# Patient Record
Sex: Female | Born: 1940 | Race: White | Hispanic: No | Marital: Married | Smoking: Never smoker
Health system: Southern US, Community
[De-identification: ages and names within clinical notes are randomized; demographics above are authoritative.]

## PROBLEM LIST (undated history)

## (undated) DIAGNOSIS — I10 Essential (primary) hypertension: Secondary | ICD-10-CM

## (undated) DIAGNOSIS — M1990 Unspecified osteoarthritis, unspecified site: Secondary | ICD-10-CM

## (undated) DIAGNOSIS — T7840XA Allergy, unspecified, initial encounter: Secondary | ICD-10-CM

## (undated) HISTORY — DX: Unspecified osteoarthritis, unspecified site: M19.90

## (undated) HISTORY — PX: ABDOMINAL HYSTERECTOMY: SHX81

## (undated) HISTORY — PX: CHOLECYSTECTOMY: SHX55

## (undated) HISTORY — PX: CARPAL TUNNEL RELEASE: SHX101

## (undated) HISTORY — DX: Essential (primary) hypertension: I10

## (undated) HISTORY — DX: Allergy, unspecified, initial encounter: T78.40XA

---

## 2002-06-30 ENCOUNTER — Encounter: Admission: RE | Admit: 2002-06-30 | Discharge: 2002-06-30 | Payer: Self-pay | Admitting: Family Medicine

## 2002-06-30 ENCOUNTER — Encounter: Payer: Self-pay | Admitting: Family Medicine

## 2003-10-20 ENCOUNTER — Inpatient Hospital Stay (HOSPITAL_COMMUNITY): Admission: EM | Admit: 2003-10-20 | Discharge: 2003-10-21 | Payer: Self-pay | Admitting: Emergency Medicine

## 2003-10-20 ENCOUNTER — Encounter (INDEPENDENT_AMBULATORY_CARE_PROVIDER_SITE_OTHER): Payer: Self-pay | Admitting: *Deleted

## 2004-09-06 ENCOUNTER — Encounter: Admission: RE | Admit: 2004-09-06 | Discharge: 2004-09-06 | Payer: Self-pay | Admitting: Family Medicine

## 2005-02-06 ENCOUNTER — Encounter: Admission: RE | Admit: 2005-02-06 | Discharge: 2005-02-06 | Payer: Self-pay | Admitting: Family Medicine

## 2005-09-17 ENCOUNTER — Other Ambulatory Visit: Admission: RE | Admit: 2005-09-17 | Discharge: 2005-09-17 | Payer: Self-pay | Admitting: Family Medicine

## 2006-02-19 ENCOUNTER — Encounter: Admission: RE | Admit: 2006-02-19 | Discharge: 2006-02-19 | Payer: Self-pay | Admitting: Family Medicine

## 2009-12-26 ENCOUNTER — Encounter: Admission: RE | Admit: 2009-12-26 | Discharge: 2009-12-26 | Payer: Self-pay | Admitting: Family Medicine

## 2010-11-22 ENCOUNTER — Observation Stay (HOSPITAL_COMMUNITY)
Admission: EM | Admit: 2010-11-22 | Discharge: 2010-11-24 | Payer: Self-pay | Source: Home / Self Care | Attending: Internal Medicine | Admitting: Internal Medicine

## 2010-11-23 ENCOUNTER — Encounter (INDEPENDENT_AMBULATORY_CARE_PROVIDER_SITE_OTHER): Payer: Self-pay | Admitting: Internal Medicine

## 2010-12-14 ENCOUNTER — Encounter: Payer: Self-pay | Admitting: Family Medicine

## 2010-12-18 NOTE — Discharge Summary (Addendum)
Cynthia Daugherty, Cynthia Daugherty             ACCOUNT NO.:  [id number]  MEDICAL RECORD NO.:  [id number]          PATIENT TYPE:  OBV  LOCATION:  3730                         FACILITY:  [facility]  PHYSICIAN:  Rock Nephew, MD       DATE OF BIRTH:  08/25/1941  DATE OF ADMISSION:  11/22/2010 DATE OF DISCHARGE:  11/24/2010                        DISCHARGE SUMMARY - REFERRING   PRIMARY CARE PHYSICIAN:  Juluis Rainier, MD.  DISCHARGE DIAGNOSES: 1. Chest pain probable noncardiac chest pain with a negative stress     test. 2. Hypertension. 3. Reflux. 4. Increased lipase. 5. Dyspepsia.  DISCHARGE MEDICATIONS: 1. Zofran 4 mg by mouth every 6 hours needed. 2. Pantoprazole 40 mg by mouth twice daily. 3. Vitamin D3 over-the-counter 3 tablets by mouth daily.  DISPOSITION:  The patient is discharged home.  The patient should follow up with Dr. Juluis Rainier within 1 week.  The patient should obtain a referral to see a gastroenterologist.CONSULTATIONS:  Armanda Magic, MD, [facility].  PROCEDURES PERFORMED:  The patient had a chest x-ray that showed no acute cardiopulmonary process.  The patient had a CT scan of the abdomen and pelvis with contrast, which showed no acute intraabdominal or pelvic pathology.  Specifically, the pancreas appears normal and no peripancreatic inflammatory change or collection is identified.  The patient had a myocardial imaging with a SPECT rest and exercise, which was a normal examination.  A derived left ventricular ejection fraction was 69%.  DIET:  Regular.  INITIAL HISTORY AND PHYSICAL:  A chief complaint of chest pressure, nausea, and sweats.  A 70 year old female, who for the past 1 week has been having some intermittent chest pressure-like, which is fairly severe and thought to be like indigestion or severe heartburn.  She has felt fatigued and she has occasional diaphoresis.  She was admitted to the hospital for a chest pain evaluation.  PROBLEM  LIST: 1. Chest pain.  The patient was evaluated by Dr. Armanda Magic of [facility]     Cardiology.  She did not think that this was cardiac chest pain.     The patient had a stress test done and the stress test was     negative.  The patient's cardiac enzymes were negative.  The     patient will need no further cardiac workup.  The patient will need     an outpatient referral to see a gastroenterologist for a possible     upper endoscopy. 2. Hypertension.  The patient's blood pressure is controlled.  The     patient received some hydrochlorothiazide in the hospital, but it     does not appear to be her home medication so we will not discharge     her on antihypertensives. 3. Increased lipase and dyspepsia.  The patient has been having     dyspepsia as well as the patient had a lipase that was initially     elevated at 175 but was 35 the day after.  The patient's CT scan of     the abdomen and pelvis was unremarkable.  The patient will need a     GI consult for a possible  upper endoscopy as an outpatient. 4. DVT prophylaxis.  The patient received some Lovenox. 5. Possible history of GERD.  The patient was placed on a PPI.  The     patient will be discharged on a PPI. 6. Fibromyalgia.  The patient has a history of fibromyalgia, which we     will monitor.     Rock Nephew, MD     NH/MEDQ  D:  11/24/2010  T:  11/24/2010  Job:  [id number]  cc:   Juluis Rainier, M.D. Armanda Magic, M.D.  Electronically Signed by Rock Nephew MD on 12/18/2010 06:12:02 PM

## 2010-12-21 NOTE — H&P (Signed)
NAME:  Cynthia Daugherty, LANDECK NO.:  [id number]  MEDICAL RECORD NO.:  [id number]          PATIENT TYPE:  EMS  LOCATION:  MAJO                         FACILITY:  [facility]  PHYSICIAN:  Michiel Cowboy, MDDATE OF BIRTH:  1941/10/03  DATE OF ADMISSION:  11/22/2010 DATE OF DISCHARGE:                             HISTORY & PHYSICAL   ATTENDING PHYSICIAN:  Michiel Cowboy, MD  PRIMARY CARE PROVIDER:  Juluis Rainier, MD, [employer] [facility].  CHIEF COMPLAINT:  Chest pressure, nausea, sweats.  The patient is a 70 year old female who for the past 1 week have been having intermittent chest pressure-like which was fairly severe thought like indigestion or severe heartburn.  She felt fatigued.  She had occasional diaphoresis, but no fevers, no chills.  She has had some nausea, but no vomiting.  No diarrhea.  No significant constipation. She does not say can tell for sure if exertion makes it worse or better, but does state that she feels very fatigued with exertion which is new for her.  Otherwise, no other complaints.  She have had a little bit of epigastric discomfort as well.  The pain is non radiational otherwise.  REVIEW OF SYSTEMS:  Negative except for HPI.  PAST MEDICAL HISTORY: 1. Significant for GERD. 2. Fibromyalgia. 3. Status post cholecystectomy. 4. History of hysterectomy.  SOCIAL HISTORY:  The patient used to smoke about 45 years ago and not currently, does not drink or abuse drugs.  FAMILY HISTORY:  Significant for mother and sister with diabetes, but no coronary artery disease in the family.  ALLERGIES:  To SULFA.  MEDICATIONS:  Recently, have been taking the baby aspirin and Prilosec as needed.  PHYSICAL EXAMINATION:  VITAL SIGNS:  Temperature 98.2, blood pressure 165/61.  Of note, she usually does not have elevated blood pressure, pulse 58, respirations 16, satting 97% on room air. GENERAL:  The patient appears to be  in no acute distress. HEAD:  Nontraumatic.  Moist mucous membranes. LUNGS:  Clear to auscultation bilaterally. HEART:  Regular rate and rhythm.  No murmurs appreciated. ABDOMEN:  Slightly obese.  She does claim there is a mild epigastric tenderness.  There is no rebound or guarding. LOWER EXTREMITIES:  Without clubbing, cyanosis or edema. NEUROLOGICAL:  Grossly intact. SKIN:  Clean, dry and intact.  No rashes noted.  LABORATORY DATA:  White blood cell count 7.4, hemoglobin 15.  Sodium 139, potassium 4.1, creatinine 0.7, blood sugar 102, albumin 3.8, lipase slightly elevated 175.  UA showing 3-6 white blood cells, but otherwise unremarkable.  Cardiac enzymes negative.  Chest x-ray showed no changes. EKG showing sinus bradycardia at heart rate of 58.  There is no ST changes.  There was just ischemia.  Q waves are slightly more peaked than her prior EKG, but this will only change I can notice.  This is otherwise unremarkable.  ASSESSMENT AND PLAN: 1. This is a 70 year old female with history of chest pressure for     intermittent for past 1 week.  At this point, she has continuously     negative cardiac markers.  No changes on EKG, but given her  advanced age she is to be further evaluated. 2. Chest pain.  We will admit.  We will continue to cycle cardiac     enzymes.  We will attempt to get an echogram to evaluate for wall     motion abnormality, further stratify with fasting lipid panel,     hemoglobin A1c.  I believe she would benefit from a stress test.     We will call Cardiology in a.m., now being along weekend I am not     sure when this can be done, this possibly has to be followed up as     an outpatient. 3. Elevated blood pressure who has had a history of hypertension and     we will follow and we will write for hydralazine as needed.  We     will avoid labetalol or beta-blockers as she has slightly low heart     rate. 4. Elevated lipase.  We will follow at this point.   She does not     clinically say when she has pancreatitis.  A CT scan did not show     any evidence of pancreatitis.  All of this suggestive of isolated     lipase elevation without current pancreatitis. 5. Prophylaxis Protonix and Lovenox.     Michiel Cowboy, MD     AVD/MEDQ  D:  11/23/2010  T:  11/23/2010  Job:  [id number]  cc:   Juluis Rainier, M.D.  Electronically Signed by Therisa Doyne MD on 12/21/2010 06:15:16 AM

## 2011-01-08 ENCOUNTER — Ambulatory Visit (HOSPITAL_COMMUNITY)
Admission: RE | Admit: 2011-01-08 | Discharge: 2011-01-08 | Disposition: A | Discharge status: Home / Self Care | Payer: Medicare Other | Source: Ambulatory Visit | Attending: Gastroenterology | Admitting: Gastroenterology

## 2011-01-08 ENCOUNTER — Other Ambulatory Visit: Payer: Self-pay | Admitting: Gastroenterology

## 2011-01-08 DIAGNOSIS — R1013 Epigastric pain: Secondary | ICD-10-CM | POA: Insufficient documentation

## 2011-01-08 DIAGNOSIS — R1310 Dysphagia, unspecified: Secondary | ICD-10-CM | POA: Insufficient documentation

## 2011-01-08 DIAGNOSIS — K219 Gastro-esophageal reflux disease without esophagitis: Secondary | ICD-10-CM | POA: Insufficient documentation

## 2011-01-08 DIAGNOSIS — R112 Nausea with vomiting, unspecified: Secondary | ICD-10-CM | POA: Insufficient documentation

## 2011-01-08 DIAGNOSIS — K2990 Gastroduodenitis, unspecified, without bleeding: Secondary | ICD-10-CM | POA: Insufficient documentation

## 2011-01-08 DIAGNOSIS — I10 Essential (primary) hypertension: Secondary | ICD-10-CM | POA: Insufficient documentation

## 2011-01-08 DIAGNOSIS — K2970 Gastritis, unspecified, without bleeding: Secondary | ICD-10-CM | POA: Insufficient documentation

## 2011-01-08 DIAGNOSIS — E785 Hyperlipidemia, unspecified: Secondary | ICD-10-CM | POA: Insufficient documentation

## 2011-01-15 ENCOUNTER — Other Ambulatory Visit (HOSPITAL_COMMUNITY): Payer: Self-pay | Admitting: Gastroenterology

## 2011-01-15 DIAGNOSIS — R112 Nausea with vomiting, unspecified: Secondary | ICD-10-CM

## 2011-01-15 DIAGNOSIS — R1084 Generalized abdominal pain: Secondary | ICD-10-CM

## 2011-01-24 ENCOUNTER — Ambulatory Visit (HOSPITAL_COMMUNITY)
Admission: RE | Admit: 2011-01-24 | Discharge: 2011-01-24 | Disposition: A | Discharge status: Home / Self Care | Payer: Medicare Other | Source: Ambulatory Visit | Attending: Gastroenterology | Admitting: Gastroenterology

## 2011-01-24 DIAGNOSIS — R1084 Generalized abdominal pain: Secondary | ICD-10-CM

## 2011-01-24 DIAGNOSIS — R112 Nausea with vomiting, unspecified: Secondary | ICD-10-CM | POA: Insufficient documentation

## 2011-01-24 DIAGNOSIS — R109 Unspecified abdominal pain: Secondary | ICD-10-CM | POA: Insufficient documentation

## 2011-01-24 MED ORDER — TECHNETIUM TC 99M SULFUR COLLOID
2.0000 | Freq: Once | INTRAVENOUS | Status: AC | PRN
  Administered 2011-01-24: 2 via ORAL

## 2011-02-03 LAB — COMPREHENSIVE METABOLIC PANEL
AST: 19 U/L (ref 0–37)
AST: 21 U/L (ref 0–37)
Albumin: 3.4 g/dL — ABNORMAL LOW (ref 3.5–5.2)
Alkaline Phosphatase: 53 U/L (ref 39–117)
Calcium: 8.8 mg/dL (ref 8.4–10.5)
Calcium: 9.3 mg/dL (ref 8.4–10.5)
Chloride: 105 mEq/L (ref 96–112)
Creatinine, Ser: 0.6 mg/dL (ref 0.4–1.2)
GFR calc Af Amer: >60 mL/min (ref >60)
GFR calc Af Amer: >60 mL/min (ref >60)
Glucose, Bld: 102 mg/dL — ABNORMAL HIGH (ref 70–99)
Glucose, Bld: 105 mg/dL — ABNORMAL HIGH (ref 70–99)
Potassium: 4 mEq/L (ref 3.5–5.1)
Potassium: 4.2 mEq/L (ref 3.5–5.1)
Total Bilirubin: 0.4 mg/dL (ref 0.3–1.2)
Total Bilirubin: 0.8 mg/dL (ref 0.3–1.2)

## 2011-02-03 LAB — CBC
HCT: 41.5 % (ref 36.0–46.0)
HCT: 41.5 % (ref 36.0–46.0)
Hemoglobin: 13.7 g/dL (ref 12.0–15.0)
MCH: 29.2 pg (ref 26.0–34.0)
MCH: 29.6 pg (ref 26.0–34.0)
MCHC: 32.3 g/dL (ref 30.0–36.0)
MCHC: 33 g/dL (ref 30.0–36.0)
MCHC: 33 g/dL (ref 30.0–36.0)
MCV: 89.4 fL (ref 78.0–100.0)
MCV: 89.8 fL (ref 78.0–100.0)
MCV: 90.4 fL (ref 78.0–100.0)
Platelets: 267 10*3/uL (ref 150–400)
Platelets: 270 10*3/uL (ref 150–400)
Platelets: 290 10*3/uL (ref 150–400)
RBC: 4.59 MIL/uL (ref 3.87–5.11)
RBC: 4.64 MIL/uL (ref 3.87–5.11)
RDW: 13.4 % (ref 11.5–15.5)
WBC: 6.9 10*3/uL (ref 4.0–10.5)
WBC: 7.4 10*3/uL (ref 4.0–10.5)

## 2011-02-03 LAB — CARDIAC PANEL(CRET KIN+CKTOT+MB+TROPI)
CK, MB: 1.4 ng/mL (ref 0.3–4.0)
Relative Index: INVALID (ref 0.0–2.5)
Relative Index: INVALID (ref 0.0–2.5)
Total CK: 51 U/L (ref 7–177)
Troponin I: 0.01 ng/mL (ref 0.00–0.06)
Troponin I: 0.04 ng/mL (ref 0.00–0.06)

## 2011-02-03 LAB — URINE MICROSCOPIC-ADD ON

## 2011-02-03 LAB — POCT I-STAT, CHEM 8
BUN: 11 mg/dL (ref 6–23)
Chloride: 103 mEq/L (ref 96–112)
Creatinine, Ser: 0.7 mg/dL (ref 0.4–1.2)
Potassium: 4.1 mEq/L (ref 3.5–5.1)
Sodium: 139 mEq/L (ref 135–145)
TCO2: 26 mmol/L (ref 0–100)

## 2011-02-03 LAB — URINALYSIS, ROUTINE W REFLEX MICROSCOPIC
Bilirubin Urine: NEGATIVE
Ketones, ur: NEGATIVE mg/dL
Specific Gravity, Urine: 1.004 — ABNORMAL LOW (ref 1.005–1.030)
pH: 6.5 (ref 5.0–8.0)

## 2011-02-03 LAB — PHOSPHORUS: Phosphorus: 4.3 mg/dL (ref 2.3–4.6)

## 2011-02-03 LAB — DIFFERENTIAL
Basophils Absolute: 0 10*3/uL (ref 0.0–0.1)
Basophils Relative: 0 % (ref 0–1)
Lymphocytes Relative: 30 % (ref 12–46)
Neutrophils Relative %: 62 % (ref 43–77)

## 2011-02-03 LAB — BASIC METABOLIC PANEL
CO2: 30 mEq/L (ref 19–32)
Calcium: 8.9 mg/dL (ref 8.4–10.5)
Chloride: 103 mEq/L (ref 96–112)
Creatinine, Ser: 0.83 mg/dL (ref 0.4–1.2)
GFR calc Af Amer: >60 mL/min (ref >60)
Glucose, Bld: 108 mg/dL — ABNORMAL HIGH (ref 70–99)

## 2011-02-03 LAB — TSH: TSH: 1.726 u[IU]/mL (ref 0.350–4.500)

## 2011-02-03 LAB — LIPID PANEL
Cholesterol: 193 mg/dL (ref 0–200)
LDL Cholesterol: 117 mg/dL — ABNORMAL HIGH (ref 0–99)
Total CHOL/HDL Ratio: 5.1 RATIO

## 2011-02-03 LAB — POCT CARDIAC MARKERS
CKMB, poc: <1 ng/mL — ABNORMAL LOW (ref 1.0–8.0)
Troponin i, poc: <0.05 ng/mL (ref 0.00–0.09)

## 2011-02-03 LAB — TROPONIN I: Troponin I: 0.01 ng/mL (ref 0.00–0.06)

## 2011-02-03 LAB — LIPASE, BLOOD: Lipase: 175 U/L — ABNORMAL HIGH (ref 11–59)

## 2011-02-03 LAB — HEMOGLOBIN A1C: Hgb A1c MFr Bld: 5.7 % — ABNORMAL HIGH (ref <5.7)

## 2011-02-03 LAB — CK TOTAL AND CKMB (NOT AT ARMC): Relative Index: INVALID (ref 0.0–2.5)
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
## 2011-11-12 ENCOUNTER — Emergency Department (HOSPITAL_COMMUNITY)
Admission: EM | Admit: 2011-11-12 | Discharge: 2011-11-12 | Disposition: A | Discharge status: Home / Self Care | Payer: Medicare Other | Attending: Emergency Medicine | Admitting: Emergency Medicine

## 2011-11-12 ENCOUNTER — Emergency Department (HOSPITAL_COMMUNITY): Payer: Medicare Other

## 2011-11-12 ENCOUNTER — Encounter: Payer: Self-pay | Admitting: Emergency Medicine

## 2011-11-12 DIAGNOSIS — W19XXXA Unspecified fall, initial encounter: Secondary | ICD-10-CM

## 2011-11-12 DIAGNOSIS — M545 Low back pain, unspecified: Secondary | ICD-10-CM | POA: Insufficient documentation

## 2011-11-12 DIAGNOSIS — M546 Pain in thoracic spine: Secondary | ICD-10-CM | POA: Insufficient documentation

## 2011-11-12 DIAGNOSIS — M533 Sacrococcygeal disorders, not elsewhere classified: Secondary | ICD-10-CM | POA: Insufficient documentation

## 2011-11-12 DIAGNOSIS — F0781 Postconcussional syndrome: Secondary | ICD-10-CM | POA: Insufficient documentation

## 2011-11-12 DIAGNOSIS — W108XXA Fall (on) (from) other stairs and steps, initial encounter: Secondary | ICD-10-CM | POA: Insufficient documentation

## 2011-11-12 DIAGNOSIS — R51 Headache: Secondary | ICD-10-CM | POA: Insufficient documentation

## 2011-11-12 MED ORDER — HYDROCODONE-ACETAMINOPHEN 5-500 MG PO TABS: 1.0000 | ORAL_TABLET | ORAL | Status: AC | PRN

## 2011-11-12 MED ORDER — HYDROCODONE-ACETAMINOPHEN 5-325 MG PO TABS
1.0000 | ORAL_TABLET | Freq: Once | ORAL | Status: AC
  Administered 2011-11-12: 1 via ORAL
  Filled 2011-11-12: qty 1

## 2011-11-12 NOTE — ED Notes (Signed)
Pt c/o headache, low back pain, and tailbone pain since fall on 10/31/11; pt reports that she fell backwards down stairs and hit head on concrete, unsure of loc at time of fall; has not been evaluated medically since fall; denies hx of htn - initial bp 188/81, repeat bp 177/73

## 2011-11-12 NOTE — ED Provider Notes (Signed)
History     CSN: [id number] Arrival date & time: 11/12/2011  4:55 PM   First MD Initiated Contact with Patient 11/12/11 2123      Chief Complaint  Patient presents with  . Fall  . Tailbone Pain  . Back Pain  . Headache    (Consider location/radiation/quality/duration/timing/severity/associated sxs/prior treatment) HPI  70yoF [place] healthy presents with multiple complaints after fall. Patient states that she fell approximately 2 days ago. She was going up stairs and missed a step causing her to fall backwards and she fell past 3 steps to the concrete. She states that she struck the back of her head. There is no loss of consciousness. She states she's had daily headache and lightheadedness since that time. She remains immature. She denies numbness, tingling, weakness surgeries. She's been taking aspirin and Tylenol arthritis for relief of pain area she rates the pain as a 7-8/10 at this time. She also complains of upper back pain and lower back pain. She complains of pain in the sacral area as well. The pain is worse when she sits. She denies hematuria. She denies abdominal pain, nausea, vomiting. She denies other injury. Her blood pressure was elevated on arrival. She denies chest pain, shortness of breath, difficulty urinating. She does not have a history of high blood pressure but states it has been elevated in the past few days since her injury.   ED Notes, ED Provider Notes from 11/12/11 0000 to 11/12/11 17:13:25       Corlis Hove McVey 11/12/2011 17:06      Pt c/o headache, low back pain, and tailbone pain since fall on 10/31/11; pt reports that she fell backwards down stairs and hit head on concrete, unsure of loc at time of fall; has not been evaluated medically since fall; denies hx of htn - initial bp 188/81, repeat bp 177/73    History reviewed. No pertinent past medical history.  Past Surgical History  Procedure Date  . Abdominal hysterectomy   . Cholecystectomy   .  Carpal tunnel release     No family history on file.  History  Substance Use Topics  . Smoking status: Never Smoker   . Smokeless tobacco: Not on file  . Alcohol Use: No    OB History    Grav Para Term Preterm Abortions TAB SAB Ect Mult Living                  Review of Systems  All other systems reviewed and are negative.   except as noted HPI   Allergies  Sulfa antibiotics  Home Medications   Current Outpatient Rx  Name Route Sig Dispense Refill  . VITAMIN D 1000 UNITS PO TABS Oral Take 1,000 Units by mouth daily.      Marland Kitchen VITAMIN B-12 1000 MCG PO TABS Oral Take 1,000 mcg by mouth daily.      Marland Kitchen HYDROCODONE-ACETAMINOPHEN 5-500 MG PO TABS Oral Take 1 tablet by mouth every 4 (four) hours as needed for pain. 15 tablet 0    BP 163/78  Pulse 63  Temp(Src) 97.8 F (36.6 C) (Oral)  Resp 18  SpO2 97%  Physical Exam  Nursing note and vitals reviewed. Constitutional: She is oriented to person, place, and time. She appears well-developed.  HENT:  Head: Atraumatic.  Mouth/Throat: Oropharynx is clear and moist.  Eyes: Conjunctivae and EOM are normal. Pupils are equal, round, and reactive to light.  Neck: Normal range of motion. Neck supple.  Cardiovascular: Normal rate,  regular rhythm, normal heart sounds and intact distal pulses.   Pulmonary/Chest: Effort normal and breath sounds normal. No respiratory distress. She has no wheezes. She has no rales.  Abdominal: Soft. She exhibits no distension. There is no tenderness. There is no rebound and no guarding.  Musculoskeletal: Normal range of motion.       No midline c/t/l/ ttp. +sacral ttp Min b/l lumbar paraspinal ttp Strength 5/5 all extremities  Neurological: She is alert and oriented to person, place, and time. No cranial nerve deficit. She exhibits normal muscle tone. Coordination normal.       Strength 5/5 all extremities No pronator drift No facial droop   Skin: Skin is warm and dry. No rash noted.  Psychiatric:  She has a normal mood and affect.    ED Course  Procedures (including critical care time)  Labs Reviewed - No data to display Dg Thoracic Spine 2 View  11/12/2011  *RADIOLOGY REPORT*  Clinical Data: Fall, increasing back pain  THORACIC SPINE - 2 VIEW  Comparison: None.  Findings: No fracture or dislocation is seen.  The vertebral body heights are maintained.  Moderate multilevel degenerative changes with mild curvature of the thoracolumbar spine.  Visualized lungs are clear.  Cholecystectomy clips.  IMPRESSION: No fracture or dislocation is seen.  Moderate multilevel degenerative changes.  Original Report Authenticated By: Charline Bills, M.D.   Dg Lumbar Spine Complete  11/12/2011  *RADIOLOGY REPORT*  Clinical Data: Fall, increasing back pain  LUMBAR SPINE - COMPLETE 4+ VIEW  Comparison: CT abdomen pelvis dated 11/22/2010  Findings: Five lumbar-type vertebral bodies.  No evidence of fracture or dislocation.  The vertebral body heights are maintained.  Mild multilevel degenerative changes with stable grade 1 anterolisthesis of L4 on L5.  Visualized bony pelvis appears intact.  Bilateral hip joint spaces are symmetric.  Cholecystectomy clips.  IMPRESSION: No fracture or dislocation is seen.  Mild multilevel degenerative changes with stable grade 1 anterolisthesis of L4 on L5.  Original Report Authenticated By: Charline Bills, M.D.   Dg Sacrum/coccyx  11/12/2011  *RADIOLOGY REPORT*  Clinical Data: Fall, increasing back pain  SACRUM AND COCCYX - 2+ VIEW  Comparison: None.  Findings: No fracture or dislocation is seen.  Degenerative changes of the pubic symphysis.  IMPRESSION: No fracture or dislocation is seen.  Original Report Authenticated By: Charline Bills, M.D.   Ct Head Wo Contrast  11/12/2011  *RADIOLOGY REPORT*  Clinical Data: Fall, headache  CT HEAD WITHOUT CONTRAST  Technique:  Contiguous axial images were obtained from the base of the skull through the vertex without contrast.   Comparison: 12/26/2009  Findings: No evidence of parenchymal hemorrhage or extra-axial fluid collection. No mass lesion, mass effect, or midline shift.  No CT evidence of acute infarction.  Mild subcortical white matter and periventricular small vessel ischemic changes.  Mucosal retention cyst in the left ethmoid sinus.  Visualized paranasal sinuses and mastoid air cells are otherwise clear.  No evidence of calvarial fracture.  IMPRESSION: No evidence of acute intracranial abnormality.  Original Report Authenticated By: Charline Bills, M.D.     1. Fall   2. Post concussive syndrome      MDM  The patient is 10 days s/p mechanical fall. She has persistent headaches which is likely related to post concussive syndrome. We'll obtain a CT head to rule out subacute subdural hematoma. X-rays thoracic lumbar and sacral spine. Pain control. Reassess her blood pressure after relief of her pain. Anticipate discharge to home.  CT  head, x-rays reviewed and unremarkable. The patient likely has a contusion of her sacral/coccyx bone. Vicodin, supportive care, PMD f/u. Given information regarding post concussive syndrome. She will f/u with her PMD for recheck of her BP which is improved after moderate pain control  BP 163/78  Pulse 63  Temp(Src) 97.8 F (36.6 C) (Oral)  Resp 18  SpO2 97%           Forbes Cellar, MD 11/12/11 2304

## 2013-03-20 ENCOUNTER — Ambulatory Visit: Payer: Medicare Other

## 2013-03-20 ENCOUNTER — Ambulatory Visit (INDEPENDENT_AMBULATORY_CARE_PROVIDER_SITE_OTHER): Payer: Medicare Other | Admitting: Emergency Medicine

## 2013-03-20 VITALS — BP 177/76 | HR 69 | Temp 98.5°F | Resp 16 | Ht 62.0 in | Wt 192.0 lb

## 2013-03-20 DIAGNOSIS — R0789 Other chest pain: Secondary | ICD-10-CM

## 2013-03-20 DIAGNOSIS — R071 Chest pain on breathing: Secondary | ICD-10-CM

## 2013-03-20 LAB — POCT CBC
Granulocyte percent: 65.7 %G (ref 37–80)
Hemoglobin: 13.9 g/dL (ref 12.2–16.2)
MCV: 91.3 fL (ref 80–97)
MID (cbc): 0.7 (ref 0–0.9)
MPV: 10.6 fL (ref 0–99.8)
POC MID %: 7.6 %M (ref 0–12)
Platelet Count, POC: 363 10*3/uL (ref 142–424)
RBC: 4.79 M/uL (ref 4.04–5.48)

## 2013-03-20 NOTE — Progress Notes (Signed)
[facility] [address] [place] [address] [phone]  Date:  03/20/2013   Name:  Cynthia Daugherty   DOB:  17-Aug-1941   MRN:  [id number]  PCP:  No primary provider on file.    Chief Complaint: Fall   History of Present Illness:  Cynthia Daugherty is a 72 y.o. very pleasant female patient who presents with the following:  Two weeks ago the patient was walking her dog and dropped the leash.  The dog ran off with the patient in hot pursuit only to step in a hole, trip and fall on her face. She had little in terms of pain following the fall but has experienced increasing pain in her left chest, breast and axilla, much worse over the past three days.  She has shortness of breath feeling with no hemoptysis or cough.  No fever or chills. No nausea or vomiting.  No stool change.  No dizziness, head injury or visual or other neuro symptoms.   No improvement with over the counter medications or other home remedies. Denies other complaint or health concern today.   There is no problem list on file for this patient.   History reviewed. No pertinent past medical history.  Past Surgical History  Procedure Laterality Date  . Abdominal hysterectomy    . Cholecystectomy    . Carpal tunnel release      History  Substance Use Topics  . Smoking status: Never Smoker   . Smokeless tobacco: Not on file  . Alcohol Use: No    No family history on file.  Allergies  Allergen Reactions  . Sulfa Antibiotics Itching and Rash    Medication list has been reviewed and updated.  Current Outpatient Prescriptions on File Prior to Visit  Medication Sig Dispense Refill  . cholecalciferol (VITAMIN D) 1000 UNITS tablet Take 1,000 Units by mouth daily.        . vitamin B-12 (CYANOCOBALAMIN) 1000 MCG tablet Take 1,000 mcg by mouth daily.         No current facility-administered medications on file prior to visit.    Review of Systems:  As per HPI, otherwise negative.     Physical Examination: Filed Vitals:   03/20/13 1715  BP: 177/76  Pulse: 69  Temp: 98.5 F (36.9 C)  Resp: 16   Filed Vitals:   03/20/13 1715  Height: 5\' 2"  (1.575 m)  Weight: 192 lb (87.091 kg)   Body mass index is 35.11 kg/(m^2). Ideal Body Weight: Weight in (lb) to have BMI = 25: 136.4  GEN: WDWN, NAD, Non-toxic, A & O x 3 HEENT: Atraumatic, Normocephalic. Neck supple. No masses, No LAD. Ears and Nose: No external deformity. CV: RRR, No M/G/R. No JVD. No thrill. No extra heart sounds. PULM: CTA B, no wheezes, crackles, rhonchi. No retractions. No resp. distress. No accessory muscle use. Chest no ecchymosis, flail, crepitus or tenderness ABD: S, NT, ND, +BS. No rebound. No HSM. EXTR: No c/c/e NEURO Normal gait.  PSYCH: Normally interactive. Conversant. Not depressed or anxious appearing.  Calm demeanor.    Assessment and Plan: Chest pain Hydrocodone with tylenol:  refused   Signed,  Phillips Odor, MD   Results for orders placed in visit on 03/20/13  POCT CBC      Result Value Range   WBC 9.1  4.6 - 10.2 K/uL   Lymph, poc 2.4  0.6 - 3.4   POC LYMPH PERCENT 26.7  10 - 50 %L  MID (cbc) 0.7  0 - 0.9   POC MID % 7.6  0 - 12 %M   POC Granulocyte 6.0  2 - 6.9   Granulocyte percent 65.7  37 - 80 %G   RBC 4.79  4.04 - 5.48 M/uL   Hemoglobin 13.9  12.2 - 16.2 g/dL   HCT, POC 16.1  09.6 - 47.9 %   MCV 91.3  80 - 97 fL   MCH, POC 29.0  27 - 31.2 pg   MCHC 31.8  31.8 - 35.4 g/dL   RDW, POC 04.5     Platelet Count, POC 363  142 - 424 K/uL   MPV 10.6  0 - 99.8 fL   [facility] reading (PRIMARY) by  Dr. Dareen Piano.  Negative chest.

## 2013-03-20 NOTE — Patient Instructions (Addendum)
Chest Contusion You have been checked for injuries to your chest. Your caregiver has not found injuries serious enough to require hospitalization. It is common to have bruises and sore muscles after an injury. These tend to feel worse the first 24 hours. You may gradually develop more stiffness and soreness over the next several hours to several days. This usually feels worse the first morning following your injury. After a few days, you will usually begin to improve. The amount of improvement depends on the amount of damage. Following the accident, if the pain in any area continues to increase or you develop new areas of pain, you should see your primary caregiver or return to the Emergency Department for re-evaluation. HOME CARE INSTRUCTIONS   Put ice on sore areas every 2 hours for 20 minutes while awake for the next 2 days.  Drink extra fluids. Do not drink alcohol.  Activity as tolerated. Lifting may make pain worse.  Only take over-the-counter or prescription medicines for pain, discomfort, or fever as directed by your caregiver. Do not use aspirin. This may increase bruising or increase bleeding. SEEK IMMEDIATE MEDICAL CARE IF:   There is a worsening of any of the problems that brought you in for care.  Shortness of breath, dizziness or fainting develop.  You have chest pain, difficulty breathing, or develop pain going down the left arm or up into jaw.  You feel sick to your stomach (nausea), vomiting or sweats.  You have increasing belly (abdominal) discomfort.  There is blood in your urine, stool, or if you vomit blood.  There is pain in either shoulder in an area where a shoulder strap would be.  You have feelings of lightheadedness, or if you should have a fainting episode.  You have numbness, tingling, weakness, or problems with the use of your arms or legs.  Severe headaches not relieved with medications develop.  You have a change in bowel or bladder control.  There  is increasing pain in any areas of the body. If you feel your symptoms are worsening, and you are not able to see your primary caregiver, return to the Emergency Department immediately. MAKE SURE YOU:   Understand these instructions.  Will watch your condition.  Will get help right away if you are not doing well or get worse. Document Released: 08/05/2001 Document Revised: 02/02/2012 Document Reviewed: 06/28/2008 [employer] Patient Information 2013 [employer].  

## 2013-06-04 ENCOUNTER — Ambulatory Visit (INDEPENDENT_AMBULATORY_CARE_PROVIDER_SITE_OTHER): Payer: Medicare Other | Admitting: Family Medicine

## 2013-06-04 ENCOUNTER — Ambulatory Visit: Payer: Medicare Other

## 2013-06-04 VITALS — BP 124/68 | HR 80 | Temp 98.4°F | Resp 18 | Wt 190.0 lb

## 2013-06-04 DIAGNOSIS — R8281 Pyuria: Secondary | ICD-10-CM

## 2013-06-04 DIAGNOSIS — M797 Fibromyalgia: Secondary | ICD-10-CM

## 2013-06-04 DIAGNOSIS — M549 Dorsalgia, unspecified: Secondary | ICD-10-CM

## 2013-06-04 DIAGNOSIS — I10 Essential (primary) hypertension: Secondary | ICD-10-CM

## 2013-06-04 DIAGNOSIS — R82998 Other abnormal findings in urine: Secondary | ICD-10-CM

## 2013-06-04 DIAGNOSIS — IMO0001 Reserved for inherently not codable concepts without codable children: Secondary | ICD-10-CM

## 2013-06-04 LAB — POCT URINALYSIS DIPSTICK
Bilirubin, UA: NEGATIVE
Blood, UA: NEGATIVE
Glucose, UA: NEGATIVE
Ketones, UA: NEGATIVE
Nitrite, UA: NEGATIVE
Protein, UA: NEGATIVE
Spec Grav, UA: <=1.005
Urobilinogen, UA: 0.2
pH, UA: 6

## 2013-06-04 LAB — POCT UA - MICROSCOPIC ONLY
Casts, Ur, LPF, POC: NEGATIVE
Crystals, Ur, HPF, POC: NEGATIVE
Mucus, UA: NEGATIVE
Yeast, UA: NEGATIVE

## 2013-06-04 MED ORDER — TRAMADOL HCL 50 MG PO TABS: 50.0000 mg | ORAL_TABLET | Freq: Three times a day (TID) | ORAL | Status: DC | PRN

## 2013-06-04 MED ORDER — CYCLOBENZAPRINE HCL 5 MG PO TABS: 5.0000 mg | ORAL_TABLET | Freq: Three times a day (TID) | ORAL | Status: DC | PRN

## 2013-06-04 NOTE — Progress Notes (Signed)
This is a 72 year old retired woman with excruciating low back pain. The onset is associated with stepping in a hole while picking berries one week ago. The pain has been unrelenting, worse when she sits, persistent even when she goes to bed. She is very stoic person but the constant pain has brought her in for evaluation and amelioration of the pain.  Patient is noted no blood in her urine, and is not having any incontinence. She has no abdominal pain. She has no fever. She has no pain radiating into her legs or numbness or loss of strength in her legs.  Objective: Patient is very pleasant, alert, appropriate, but obviously in some degree of pain as she tries to slowly move into a more comfortable position on the exam table. Skin: No rash Palpation of the lumbar spine: Minimal pain particularly in the right paraspinal region on deep palpation. There is no bony tenderness. There is marked spasm of both paraspinal sets of muscles.  Patient is able to bend forward with some pain and is able to walk normally although slowly and with stiffness in her back. Reflexes in the knees and ankles are normal There is no edema in the extremities and straight leg raising is essentially normal when sitting.  [facility] reading (PRIMARY) by  Dr. Milus Glazier lumbar spine series.  Minimal scoliosis, no bony fracture,   Results for orders placed in visit on 06/04/13  POCT UA - MICROSCOPIC ONLY      Result Value Range   WBC, Ur, HPF, POC 4-7     RBC, urine, microscopic 1-2     Bacteria, U Microscopic 4+     Mucus, UA neg     Epithelial cells, urine per micros 0-1     Crystals, Ur, HPF, POC neg     Casts, Ur, LPF, POC neg     Yeast, UA neg    POCT URINALYSIS DIPSTICK      Result Value Range   Color, UA yellow     Clarity, UA clear     Glucose, UA neg     Bilirubin, UA neg     Ketones, UA neg     Spec Grav, UA <=1.005     Blood, UA neg     pH, UA 6.0     Protein, UA neg     Urobilinogen, UA 0.2     Nitrite,  UA neg     Leukocytes, UA Trace     [facility] reading (PRIMARY) by  Dr. Milus Glazier:  Mild scoliosis, minimal subluxation of L5 on S1.   Back pain - Plan: DG Lumbar Spine Complete, POCT UA - Microscopic Only, POCT urinalysis dipstick, cyclobenzaprine (FLEXERIL) 5 MG tablet, traMADol (ULTRAM) 50 MG tablet  Fibromyalgia  Hypertension  Pyuria - Plan: Urine culture  Signed, Elvina Sidle, MD

## 2013-06-06 ENCOUNTER — Telehealth: Payer: Self-pay

## 2013-06-06 DIAGNOSIS — R8281 Pyuria: Secondary | ICD-10-CM

## 2013-06-06 LAB — URINE CULTURE: Colony Count: >=100000

## 2013-06-06 MED ORDER — CIPROFLOXACIN HCL 250 MG PO TABS: 250.0000 mg | ORAL_TABLET | Freq: Two times a day (BID) | ORAL | Status: DC

## 2013-06-06 NOTE — Telephone Encounter (Signed)
Cynthia Daugherty, this is done,

## 2013-06-06 NOTE — Telephone Encounter (Signed)
Message copied by [name] on Mon Jun 06, 2013  8:29 AM ------      Message from: Cynthia Daugherty      Created: Sun Jun 05, 2013  7:15 PM       Please notify patient of positive urine culture and importance of finishing antibiotics.  Return if symptoms continue. ------

## 2013-06-06 NOTE — Telephone Encounter (Signed)
Dr. Elbert Ewings, can we please call in an abx for the pt. One was never called in. Please send this back to me so I can let her know when it's been sent. Thanks [employer]

## 2013-06-06 NOTE — Telephone Encounter (Signed)
I have advised patient. To you FYI

## 2013-07-14 ENCOUNTER — Other Ambulatory Visit: Payer: Self-pay | Admitting: Family Medicine

## 2014-02-11 ENCOUNTER — Ambulatory Visit (INDEPENDENT_AMBULATORY_CARE_PROVIDER_SITE_OTHER): Payer: Medicare Other | Admitting: Emergency Medicine

## 2014-02-11 ENCOUNTER — Ambulatory Visit: Payer: Medicare Other

## 2014-02-11 VITALS — BP 128/72 | HR 67 | Temp 97.7°F | Resp 16 | Ht 60.5 in | Wt 188.2 lb

## 2014-02-11 DIAGNOSIS — R05 Cough: Secondary | ICD-10-CM

## 2014-02-11 DIAGNOSIS — R059 Cough, unspecified: Secondary | ICD-10-CM

## 2014-02-11 MED ORDER — AZITHROMYCIN 250 MG PO TABS: ORAL_TABLET | ORAL | Status: DC

## 2014-02-11 MED ORDER — HYDROCODONE-HOMATROPINE 5-1.5 MG/5ML PO SYRP: 5.0000 mL | ORAL_SOLUTION | Freq: Three times a day (TID) | ORAL | Status: DC | PRN

## 2014-02-11 NOTE — Patient Instructions (Signed)

## 2014-02-11 NOTE — Progress Notes (Signed)
   Subjective:    Patient ID: Cynthia Daugherty, female    DOB: 03-Jun-1941, 73 y.o.   MRN: [id number]  HPI 73 year old Caucasian female presents with cough x 3 weeks with sternal burning sensation.  Admits to having history of heart burn. She takes tums as needed.  Pt admits to coughing phlegm green colored.  Pt is prior smoker at least 40 years ago.  Pt denies shortness of breath - describes it as heaviness in chest.       Review of Systems     Objective:   Physical Exam Throat clear Ears clear Nares clear Chest auscultates - congestion in bilateral bases, no wheezing.  [facility] reading (PRIMARY) by  Dr.Avina Eberle lung fields are clear. There appears to be a skin fold on the right repeat film to be done to rule out a focal pneumothrax. On repeat films this area disappear.       Assessment & Plan:  I advised her to take her reflux medication. We'll treat with Zithromax and Hycodan.

## 2016-01-05 ENCOUNTER — Emergency Department (HOSPITAL_BASED_OUTPATIENT_CLINIC_OR_DEPARTMENT_OTHER)
Admission: EM | Admit: 2016-01-05 | Discharge: 2016-01-05 | Disposition: A | Discharge status: Home / Self Care | Payer: PPO | Attending: Emergency Medicine | Admitting: Emergency Medicine

## 2016-01-05 ENCOUNTER — Other Ambulatory Visit: Payer: Self-pay

## 2016-01-05 ENCOUNTER — Emergency Department (HOSPITAL_BASED_OUTPATIENT_CLINIC_OR_DEPARTMENT_OTHER): Payer: PPO

## 2016-01-05 ENCOUNTER — Encounter (HOSPITAL_BASED_OUTPATIENT_CLINIC_OR_DEPARTMENT_OTHER): Payer: Self-pay | Admitting: *Deleted

## 2016-01-05 ENCOUNTER — Ambulatory Visit (INDEPENDENT_AMBULATORY_CARE_PROVIDER_SITE_OTHER): Payer: PPO | Admitting: Family Medicine

## 2016-01-05 VITALS — BP 135/76 | HR 70 | Temp 98.3°F | Resp 16 | Ht 62.0 in | Wt 194.0 lb

## 2016-01-05 DIAGNOSIS — R079 Chest pain, unspecified: Secondary | ICD-10-CM

## 2016-01-05 DIAGNOSIS — R0602 Shortness of breath: Secondary | ICD-10-CM | POA: Insufficient documentation

## 2016-01-05 DIAGNOSIS — I10 Essential (primary) hypertension: Secondary | ICD-10-CM | POA: Insufficient documentation

## 2016-01-05 DIAGNOSIS — Z8739 Personal history of other diseases of the musculoskeletal system and connective tissue: Secondary | ICD-10-CM | POA: Insufficient documentation

## 2016-01-05 DIAGNOSIS — Z79899 Other long term (current) drug therapy: Secondary | ICD-10-CM | POA: Insufficient documentation

## 2016-01-05 LAB — CBC
HEMATOCRIT: 43.8 % (ref 36.0–46.0)
HEMOGLOBIN: 14.1 g/dL (ref 12.0–15.0)
MCH: 28.9 pg (ref 26.0–34.0)
MCHC: 32.2 g/dL (ref 30.0–36.0)
MCV: 89.8 fL (ref 78.0–100.0)
Platelets: 370 10*3/uL (ref 150–400)
RBC: 4.88 MIL/uL (ref 3.87–5.11)
RDW: 13.9 % (ref 11.5–15.5)
WBC: 8.2 10*3/uL (ref 4.0–10.5)

## 2016-01-05 LAB — BASIC METABOLIC PANEL
ANION GAP: 9 (ref 5–15)
BUN: 12 mg/dL (ref 6–20)
CO2: 26 mmol/L (ref 22–32)
Calcium: 9.1 mg/dL (ref 8.9–10.3)
Chloride: 101 mmol/L (ref 101–111)
Creatinine, Ser: 0.71 mg/dL (ref 0.44–1.00)
GLUCOSE: 127 mg/dL — AB (ref 65–99)
POTASSIUM: 4.1 mmol/L (ref 3.5–5.1)
Sodium: 136 mmol/L (ref 135–145)

## 2016-01-05 LAB — TROPONIN I: Troponin I: <0.03 ng/mL (ref <0.031)

## 2016-01-05 LAB — BRAIN NATRIURETIC PEPTIDE: B NATRIURETIC PEPTIDE 5: 76 pg/mL (ref 0.0–100.0)

## 2016-01-05 MED ORDER — ASPIRIN 81 MG PO CHEW: 324.0000 mg | CHEWABLE_TABLET | Freq: Once | ORAL | Status: AC

## 2016-01-05 MED ORDER — ASPIRIN 81 MG PO CHEW
CHEWABLE_TABLET | ORAL | Status: AC
  Filled 2016-01-05: qty 4

## 2016-01-05 NOTE — Discharge Instructions (Signed)

## 2016-01-05 NOTE — ED Notes (Addendum)
Chest heaviness and sore chest x 1 week with radiation into her back.  States that the pain is worse with deep inspiration.  Denies N/Vdiaphoresis.  Reports weight gain, ankle swelling in the past 2-3 months.

## 2016-01-05 NOTE — Patient Instructions (Signed)
Please proceed to the ER of your choice right away for further evaluation I will call the [facility] and let them know that you are on your way

## 2016-01-05 NOTE — ED Provider Notes (Signed)
CSN: [id number]     Arrival date & time 01/05/16  1333 History   First MD Initiated Contact with Patient 01/05/16 1351     Chief Complaint  Patient presents with  . Chest Pain     (Consider location/radiation/quality/duration/timing/severity/associated sxs/prior Treatment) Patient is a 75 y.o. female presenting with chest pain. The history is provided by the patient and a friend.  Chest Pain Pain location:  Substernal area Pain quality: pressure and sharp   Pain radiates to:  Upper back Pain radiates to the back: yes   Pain severity:  Moderate Onset quality:  Gradual Duration:  1 week Timing:  Constant Progression:  Worsening Chronicity:  New Relieved by:  Rest Worsened by:  Movement and deep breathing Ineffective treatments:  None tried Associated symptoms: shortness of breath   Associated symptoms: no dizziness, no fever, no headache, no nausea, no palpitations and not vomiting   Risk factors: hypertension   Risk factors: no coronary artery disease, no diabetes mellitus, no high cholesterol and no smoking     75 yo F with a chief complaint chest pain. This been going on for the past week. Worse with movement palpation. Patient also thinks it's worse on exertion has some difficulty taking a deep breath with it. Pain radiates to her back on deep breath. Patient denies history of PE or DVT. Denies recent surgery. Patient denies unilateral lower extremity edema. No history of cancer. Denies injury. Denies cough congestion fevers.   Past Medical History  Diagnosis Date  . Allergy   . Arthritis   . Hypertension    Past Surgical History  Procedure Laterality Date  . Abdominal hysterectomy    . Cholecystectomy    . Carpal tunnel release     Family History  Problem Relation Age of Onset  . Cancer Mother   . Diabetes Mother   . Cancer Father   . Cancer Sister   . Diabetes Sister   . Cancer Maternal Grandmother    Social History  Substance Use Topics  . Smoking status:  Never Smoker   . Smokeless tobacco: Never Used  . Alcohol Use: No   OB History    No data available     Review of Systems  Constitutional: Negative for fever and chills.  HENT: Negative for congestion and rhinorrhea.   Eyes: Negative for redness and visual disturbance.  Respiratory: Positive for chest tightness and shortness of breath. Negative for wheezing.   Cardiovascular: Positive for chest pain. Negative for palpitations.  Gastrointestinal: Negative for nausea and vomiting.  Genitourinary: Negative for dysuria and urgency.  Musculoskeletal: Negative for myalgias and arthralgias.  Skin: Negative for pallor and wound.  Neurological: Negative for dizziness and headaches.      Allergies  Sulfa antibiotics  Home Medications   Prior to Admission medications   Medication Sig Start Date End Date Taking? Authorizing Provider  amLODipine (NORVASC) 2.5 MG tablet Take 2.5 mg by mouth daily.    Historical Provider, MD  cholecalciferol (VITAMIN D) 1000 UNITS tablet Take 1,000 Units by mouth daily.      Historical Provider, MD   BP 120/79 mmHg  Pulse 57  Temp(Src) 98.2 F (36.8 C) (Oral)  Resp 18  Ht 5' 2.5" (1.588 m)  Wt 195 lb (88.451 kg)  BMI 35.08 kg/m2  SpO2 95% Physical Exam  Constitutional: She is oriented to person, place, and time. She appears well-developed and well-nourished. No distress.  HENT:  Head: Normocephalic and atraumatic.  Eyes: EOM are normal.  Pupils are equal, round, and reactive to light.  Neck: Normal range of motion. Neck supple.  Cardiovascular: Normal rate and regular rhythm.  Exam reveals no gallop and no friction rub.   No murmur heard. Pulmonary/Chest: Effort normal. She has no wheezes. She has no rales. She exhibits tenderness.  Abdominal: Soft. She exhibits no distension. There is no tenderness.  Musculoskeletal: She exhibits no edema or tenderness.  Neurological: She is alert and oriented to person, place, and time.  Skin: Skin is warm and  dry. She is not diaphoretic.  Psychiatric: She has a normal mood and affect. Her behavior is normal.  Nursing note and vitals reviewed.   ED Course  Procedures (including critical care time) Labs Review Labs Reviewed  BASIC METABOLIC PANEL - Abnormal; Notable for the following:    Glucose, Bld 127 (*)    All other components within normal limits  CBC  TROPONIN I  BRAIN NATRIURETIC PEPTIDE    Imaging Review Dg Chest 2 View  01/05/2016  CLINICAL DATA:  Chest pain EXAM: CHEST  2 VIEW COMPARISON:  02/11/2014 FINDINGS: Artifact over the right chest from multiple EKG wires. Normal heart size and mediastinal contours. No acute infiltrate or edema. No effusion or pneumothorax. No acute osseous findings. IMPRESSION: No active cardiopulmonary disease. Electronically Signed   By: Monte Fantasia M.D.   On: 01/05/2016 14:30   I have personally reviewed and evaluated these images and lab results as part of my medical decision-making.   EKG Interpretation   Date/Time:  Saturday January 05 2016 13:42:22 EST Ventricular Rate:  59 PR Interval:  142 QRS Duration: 90 QT Interval:  428 QTC Calculation: 423 R Axis:   29 Text Interpretation:  Sinus bradycardia Otherwise normal ECG No  significant change since last tracing Confirmed by Chana Lindstrom MD, Quillian Quince  [id number]) on 01/05/2016 1:53:07 PM      MDM   Final diagnoses:  Chest pain, unspecified    75 yo F with a chief complaint chest pain. This is reproducible on exam. Initial troponin was negative. Shared decision-making at bedside patient understands her current risk and feels that she is safe for discharge about a delta troponin. We'll have her follow-up with her family doctor.   3:02 PM:  I have discussed the diagnosis/risks/treatment options with the patient and believe the pt to be eligible for discharge home to follow-up with PCP. We also discussed returning to the ED immediately if new or worsening sx occur. We discussed the sx which are  most concerning (e.g., sudden worsening pain, fever, inability to tolerate by mouth) that necessitate immediate return. Medications administered to the patient during their visit and any new prescriptions provided to the patient are listed below.  Medications given during this visit Medications  aspirin 81 MG chewable tablet (  Not Given 01/05/16 1402)    New Prescriptions   No medications on file    The patient appears reasonably screen and/or stabilized for discharge and I doubt any other medical condition or other [facility] requiring further screening, evaluation, or treatment in the ED at this time prior to discharge.      Deno Etienne, DO 01/05/16 1502

## 2016-01-05 NOTE — Progress Notes (Signed)
[facility] [address], [place] [address] [phone]  Date:  01/05/2016   Name:  Cynthia Daugherty   DOB:  10-08-1941   MRN:  [id number]  PCP:  No primary care provider on file.    Chief Complaint: chest soreness and Chest Pain   History of Present Illness:  LEYANNA Daugherty is a 75 y.o. very pleasant female patient who presents with the following:  History of HTN on amlodipine, OW generally healthy  Here today with complaint of substernal "soreness" for about one week When she takes a deep breath "it cuts my breath and it feels like it cuts all the day though to my back."   The pain has been fairly consistent She has noted a mild cough Notes that her chest feels "heavy" at times Pain is non- exertional but she feels like she gets winded more easily than normal for her No fever  She does not have any history of heart trouble Her [facility]CP [facility] at [facility]- Dr. Drema Dallas  She did have a stress a few years back- negative myoview 2012.     There are no active problems to display for this patient.   Past Medical History  Diagnosis Date  . Allergy   . Arthritis   . Hypertension     Past Surgical History  Procedure Laterality Date  . Abdominal hysterectomy    . Cholecystectomy    . Carpal tunnel release      Social History  Substance Use Topics  . Smoking status: Never Smoker   . Smokeless tobacco: Never Used  . Alcohol Use: No    Family History  Problem Relation Age of Onset  . Cancer Mother   . Diabetes Mother   . Cancer Father   . Cancer Sister   . Diabetes Sister   . Cancer Maternal Grandmother     Allergies  Allergen Reactions  . Sulfa Antibiotics Itching and Rash    Medication list has been reviewed and updated.  Current Outpatient Prescriptions on File Prior to Visit  Medication Sig Dispense Refill  . amLODipine (NORVASC) 2.5 MG tablet Take 2.5 mg by mouth daily.    . cholecalciferol (VITAMIN D) 1000 UNITS tablet Take 1,000  Units by mouth daily.       No current facility-administered medications on file prior to visit.    Review of Systems:  As per HPI- otherwise negative.   Physical Examination: Filed Vitals:   01/05/16 1205  BP: 135/76  Pulse: 70  Temp: 98.3 F (36.8 C)  Resp: 16   Filed Vitals:   01/05/16 1205  Height: 5\' 2"  (1.575 m)  Weight: 194 lb (87.998 kg)   Body mass index is 35.47 kg/(m^2). Ideal Body Weight: Weight in (lb) to have BMI = 25: 136.4 GEN: WDWN, NAD, Non-toxic, A & O x 3, overweight, looks well HEENT: Atraumatic, Normocephalic. Neck supple. No masses, No LAD.  Bilateral TM wnl, oropharynx normal.  PEERL,EOMI.   Ears and Nose: No external deformity. CV: RRR, No M/G/R. No JVD. No thrill. No extra heart sounds. I am able to reproduce pain by pressing on her chest wall. However she is not sure if this is the same pain and pressure that she has noticed at home PULM: CTA B, no wheezes, crackles, rhonchi. No retractions. No resp. distress. No accessory muscle use. ABD: S, NT, ND EXTR: No c/c/e NEURO Normal gait.  PSYCH: Normally interactive. Conversant. Not depressed or anxious appearing.  Calm demeanor.   EKG:SNR, no change from previous EKG  Given asa 81 x4 po once in clinic  Assessment and Plan: Chest pain, unspecified chest pain type - Plan: EKG 12-Lead, aspirin chewable tablet 324 mg  Atypical CP- suspect chest wall pain but she does need further eval given active, persistent pain at an older age. She will proceed to the ER right away for a rule- out Declined EMS transport   Signed Lamar Blinks, MD

## 2016-01-05 NOTE — ED Notes (Signed)
Pt confirms rec 4 baby ASA - 324mg  at [facility]

## 2016-01-05 NOTE — ED Notes (Signed)
MD at bedside. 

## 2016-03-04 IMAGING — DX DG CHEST 2V
2 series · 3 of 3 positions shown · non-contrast
Comparison: 02/11/2014

CLINICAL DATA: Chest pain

EXAM:
CHEST  2 VIEW

[Series 1: chest pa · 0.14mm/px · 2 of 2 slices shown]
[im 1/2]
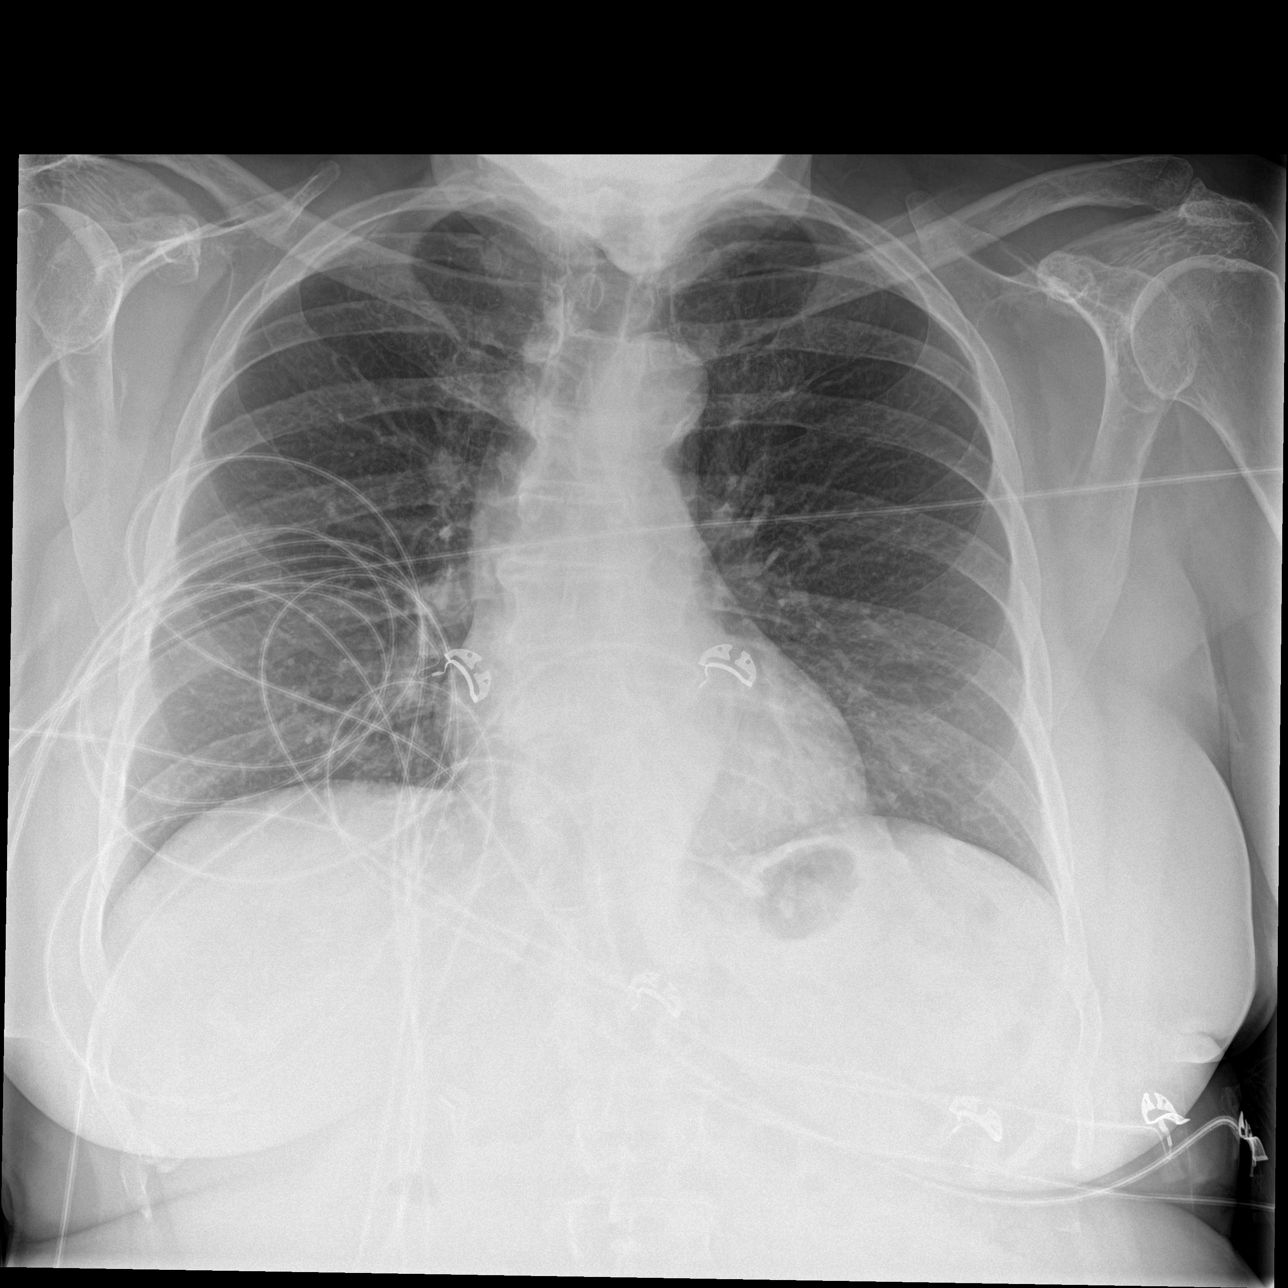
[im 2/2]
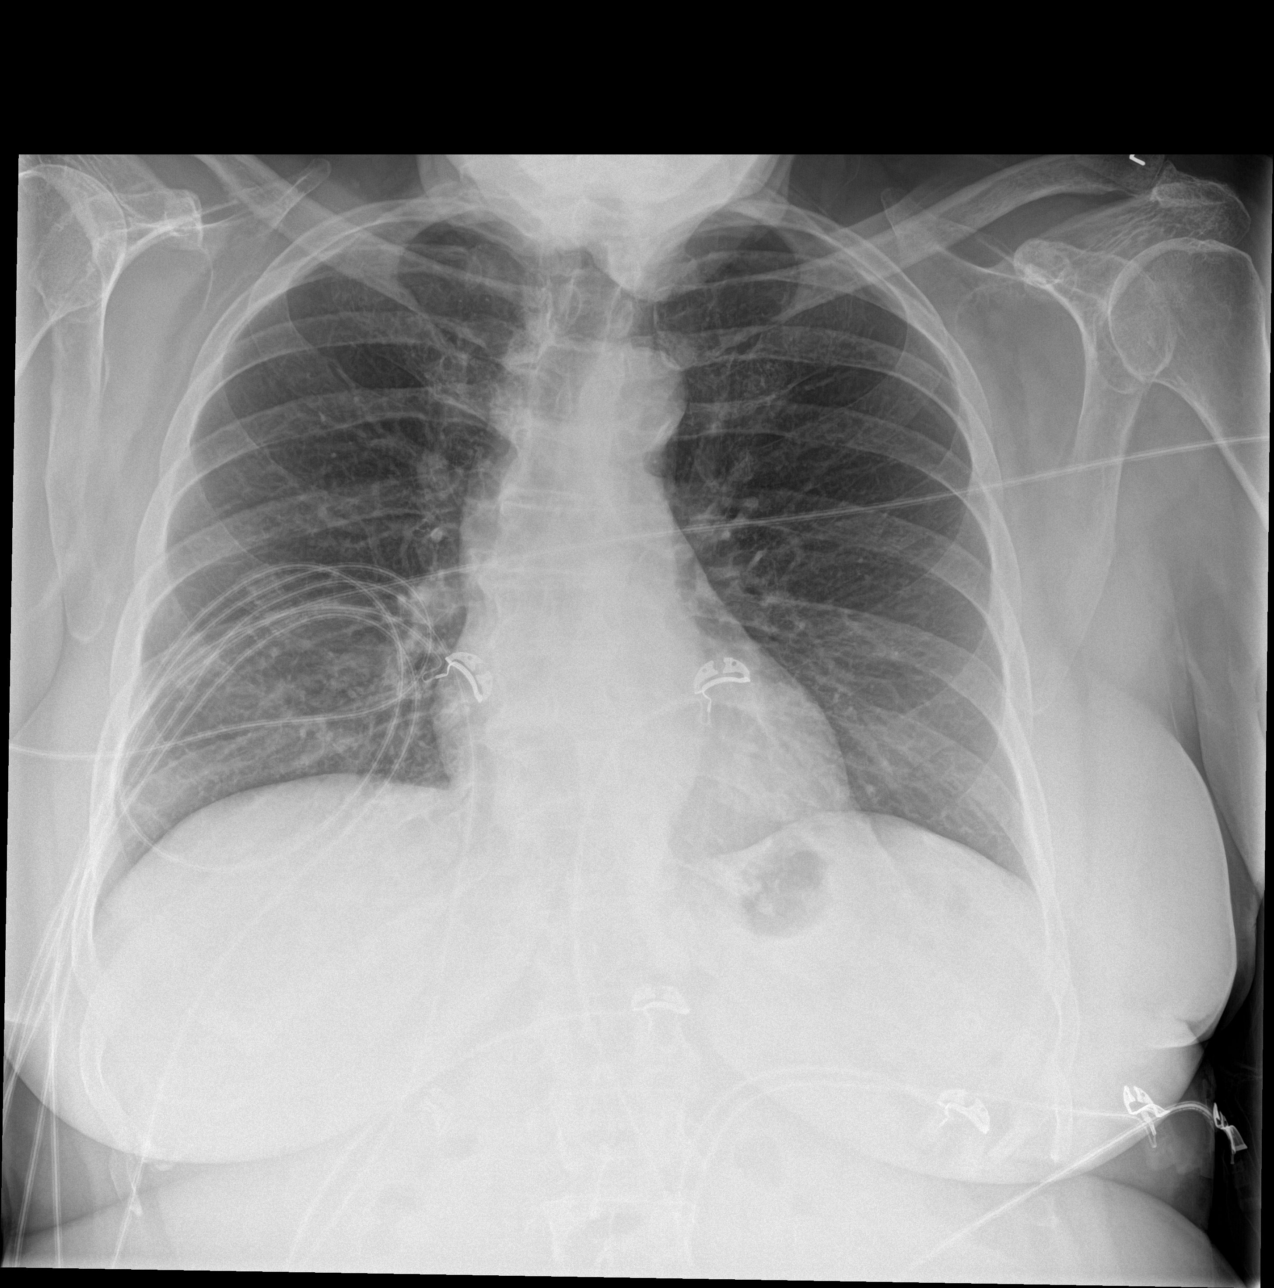

[chest lat]
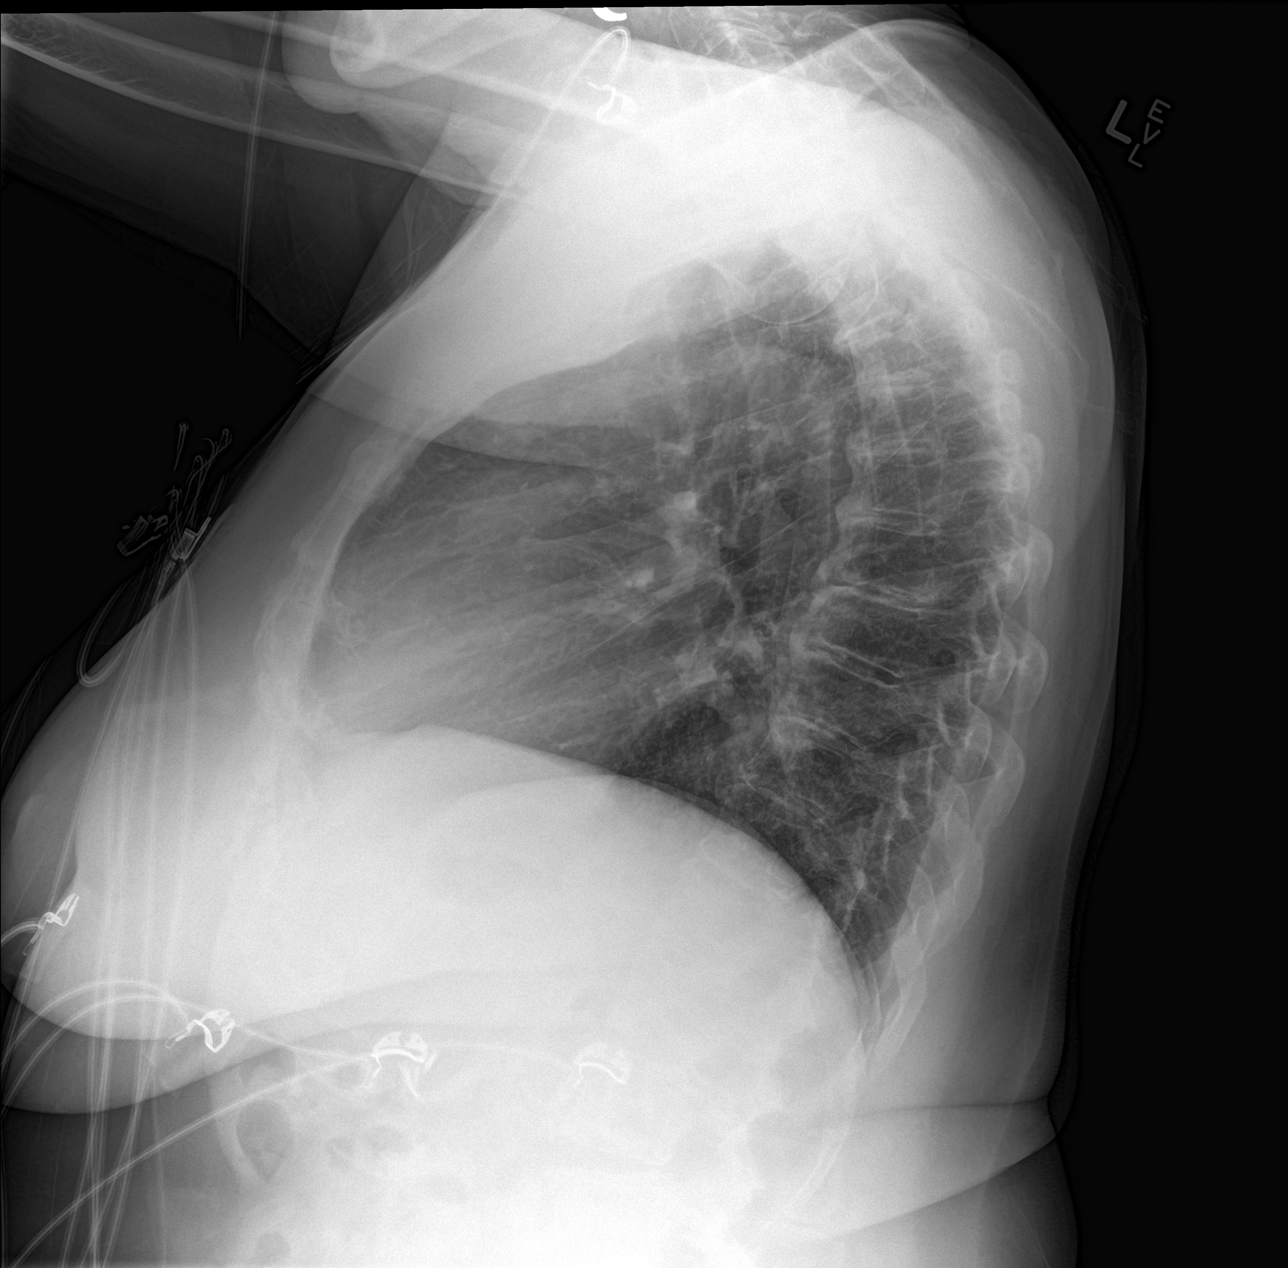

[3 of 3 positions shown; findings below may reference images not displayed]

FINDINGS: Artifact over the right chest from multiple EKG wires.

Normal heart size and mediastinal contours. No acute infiltrate or
edema. No effusion or pneumothorax. No acute osseous findings.
IMPRESSION: No active cardiopulmonary disease.

## 2016-03-06 DIAGNOSIS — E785 Hyperlipidemia, unspecified: Secondary | ICD-10-CM | POA: Diagnosis not present

## 2016-03-06 DIAGNOSIS — Z79899 Other long term (current) drug therapy: Secondary | ICD-10-CM | POA: Diagnosis not present

## 2016-03-06 DIAGNOSIS — I10 Essential (primary) hypertension: Secondary | ICD-10-CM | POA: Diagnosis not present

## 2016-03-06 DIAGNOSIS — L821 Other seborrheic keratosis: Secondary | ICD-10-CM | POA: Diagnosis not present

## 2016-03-24 DIAGNOSIS — H2513 Age-related nuclear cataract, bilateral: Secondary | ICD-10-CM | POA: Diagnosis not present

## 2016-03-24 DIAGNOSIS — H1851 Endothelial corneal dystrophy: Secondary | ICD-10-CM | POA: Diagnosis not present

## 2016-03-24 DIAGNOSIS — H43811 Vitreous degeneration, right eye: Secondary | ICD-10-CM | POA: Diagnosis not present

## 2016-03-24 DIAGNOSIS — H25013 Cortical age-related cataract, bilateral: Secondary | ICD-10-CM | POA: Diagnosis not present

## 2016-03-24 DIAGNOSIS — H11423 Conjunctival edema, bilateral: Secondary | ICD-10-CM | POA: Diagnosis not present

## 2016-03-24 DIAGNOSIS — H353131 Nonexudative age-related macular degeneration, bilateral, early dry stage: Secondary | ICD-10-CM | POA: Diagnosis not present

## 2016-03-24 DIAGNOSIS — H11153 Pinguecula, bilateral: Secondary | ICD-10-CM | POA: Diagnosis not present

## 2016-03-24 DIAGNOSIS — H5203 Hypermetropia, bilateral: Secondary | ICD-10-CM | POA: Diagnosis not present

## 2016-03-24 DIAGNOSIS — H18413 Arcus senilis, bilateral: Secondary | ICD-10-CM | POA: Diagnosis not present

## 2016-03-24 DIAGNOSIS — H52223 Regular astigmatism, bilateral: Secondary | ICD-10-CM | POA: Diagnosis not present

## 2016-07-08 DIAGNOSIS — H2512 Age-related nuclear cataract, left eye: Secondary | ICD-10-CM | POA: Diagnosis not present

## 2016-07-08 DIAGNOSIS — H18413 Arcus senilis, bilateral: Secondary | ICD-10-CM | POA: Diagnosis not present

## 2016-07-08 DIAGNOSIS — H02833 Dermatochalasis of right eye, unspecified eyelid: Secondary | ICD-10-CM | POA: Diagnosis not present

## 2016-07-08 DIAGNOSIS — H2513 Age-related nuclear cataract, bilateral: Secondary | ICD-10-CM | POA: Diagnosis not present

## 2016-08-11 DIAGNOSIS — H2512 Age-related nuclear cataract, left eye: Secondary | ICD-10-CM | POA: Diagnosis not present

## 2016-08-12 DIAGNOSIS — H2511 Age-related nuclear cataract, right eye: Secondary | ICD-10-CM | POA: Diagnosis not present

## 2016-08-25 DIAGNOSIS — H2511 Age-related nuclear cataract, right eye: Secondary | ICD-10-CM | POA: Diagnosis not present

## 2016-09-08 DIAGNOSIS — R5383 Other fatigue: Secondary | ICD-10-CM | POA: Diagnosis not present

## 2016-09-08 DIAGNOSIS — F419 Anxiety disorder, unspecified: Secondary | ICD-10-CM | POA: Diagnosis not present

## 2016-09-08 DIAGNOSIS — Z01411 Encounter for gynecological examination (general) (routine) with abnormal findings: Secondary | ICD-10-CM | POA: Diagnosis not present

## 2016-09-08 DIAGNOSIS — M8588 Other specified disorders of bone density and structure, other site: Secondary | ICD-10-CM | POA: Diagnosis not present

## 2016-09-08 DIAGNOSIS — Z79899 Other long term (current) drug therapy: Secondary | ICD-10-CM | POA: Diagnosis not present

## 2016-09-08 DIAGNOSIS — K146 Glossodynia: Secondary | ICD-10-CM | POA: Diagnosis not present

## 2016-09-08 DIAGNOSIS — Z0000 Encounter for general adult medical examination without abnormal findings: Secondary | ICD-10-CM | POA: Diagnosis not present

## 2016-09-08 DIAGNOSIS — K219 Gastro-esophageal reflux disease without esophagitis: Secondary | ICD-10-CM | POA: Diagnosis not present

## 2016-09-08 DIAGNOSIS — N898 Other specified noninflammatory disorders of vagina: Secondary | ICD-10-CM | POA: Diagnosis not present

## 2016-09-08 DIAGNOSIS — R609 Edema, unspecified: Secondary | ICD-10-CM | POA: Diagnosis not present

## 2016-09-08 DIAGNOSIS — I10 Essential (primary) hypertension: Secondary | ICD-10-CM | POA: Diagnosis not present

## 2016-09-08 DIAGNOSIS — E785 Hyperlipidemia, unspecified: Secondary | ICD-10-CM | POA: Diagnosis not present

## 2016-10-13 DIAGNOSIS — Z803 Family history of malignant neoplasm of breast: Secondary | ICD-10-CM | POA: Diagnosis not present

## 2016-10-13 DIAGNOSIS — M8589 Other specified disorders of bone density and structure, multiple sites: Secondary | ICD-10-CM | POA: Diagnosis not present

## 2016-10-13 DIAGNOSIS — Z1231 Encounter for screening mammogram for malignant neoplasm of breast: Secondary | ICD-10-CM | POA: Diagnosis not present

## 2016-10-20 DIAGNOSIS — Z961 Presence of intraocular lens: Secondary | ICD-10-CM | POA: Diagnosis not present

## 2016-10-20 DIAGNOSIS — H5201 Hypermetropia, right eye: Secondary | ICD-10-CM | POA: Diagnosis not present

## 2016-10-20 DIAGNOSIS — H5212 Myopia, left eye: Secondary | ICD-10-CM | POA: Diagnosis not present

## 2016-10-20 DIAGNOSIS — H16143 Punctate keratitis, bilateral: Secondary | ICD-10-CM | POA: Diagnosis not present

## 2016-10-20 DIAGNOSIS — H11423 Conjunctival edema, bilateral: Secondary | ICD-10-CM | POA: Diagnosis not present

## 2016-10-20 DIAGNOSIS — H353131 Nonexudative age-related macular degeneration, bilateral, early dry stage: Secondary | ICD-10-CM | POA: Diagnosis not present

## 2016-10-20 DIAGNOSIS — H18413 Arcus senilis, bilateral: Secondary | ICD-10-CM | POA: Diagnosis not present

## 2016-10-20 DIAGNOSIS — H11153 Pinguecula, bilateral: Secondary | ICD-10-CM | POA: Diagnosis not present

## 2016-10-20 DIAGNOSIS — H04123 Dry eye syndrome of bilateral lacrimal glands: Secondary | ICD-10-CM | POA: Diagnosis not present

## 2016-10-20 DIAGNOSIS — M3501 Sicca syndrome with keratoconjunctivitis: Secondary | ICD-10-CM | POA: Diagnosis not present

## 2016-10-20 DIAGNOSIS — Z9849 Cataract extraction status, unspecified eye: Secondary | ICD-10-CM | POA: Diagnosis not present

## 2016-10-20 DIAGNOSIS — H1851 Endothelial corneal dystrophy: Secondary | ICD-10-CM | POA: Diagnosis not present

## 2016-12-05 DIAGNOSIS — D126 Benign neoplasm of colon, unspecified: Secondary | ICD-10-CM | POA: Diagnosis not present

## 2016-12-05 DIAGNOSIS — K5730 Diverticulosis of large intestine without perforation or abscess without bleeding: Secondary | ICD-10-CM | POA: Diagnosis not present

## 2016-12-05 DIAGNOSIS — D123 Benign neoplasm of transverse colon: Secondary | ICD-10-CM | POA: Diagnosis not present

## 2016-12-05 DIAGNOSIS — Z800 Family history of malignant neoplasm of digestive organs: Secondary | ICD-10-CM | POA: Diagnosis not present

## 2016-12-05 DIAGNOSIS — Z86010 Personal history of colonic polyps: Secondary | ICD-10-CM | POA: Diagnosis not present

## 2016-12-09 DIAGNOSIS — D126 Benign neoplasm of colon, unspecified: Secondary | ICD-10-CM | POA: Diagnosis not present

## 2017-09-14 DIAGNOSIS — E785 Hyperlipidemia, unspecified: Secondary | ICD-10-CM | POA: Diagnosis not present

## 2017-09-14 DIAGNOSIS — M797 Fibromyalgia: Secondary | ICD-10-CM | POA: Diagnosis not present

## 2017-09-14 DIAGNOSIS — R609 Edema, unspecified: Secondary | ICD-10-CM | POA: Diagnosis not present

## 2017-09-14 DIAGNOSIS — Z0000 Encounter for general adult medical examination without abnormal findings: Secondary | ICD-10-CM | POA: Diagnosis not present

## 2017-09-14 DIAGNOSIS — Z1389 Encounter for screening for other disorder: Secondary | ICD-10-CM | POA: Diagnosis not present

## 2017-09-14 DIAGNOSIS — K219 Gastro-esophageal reflux disease without esophagitis: Secondary | ICD-10-CM | POA: Diagnosis not present

## 2017-09-14 DIAGNOSIS — F419 Anxiety disorder, unspecified: Secondary | ICD-10-CM | POA: Diagnosis not present

## 2017-09-14 DIAGNOSIS — L819 Disorder of pigmentation, unspecified: Secondary | ICD-10-CM | POA: Diagnosis not present

## 2017-09-14 DIAGNOSIS — G479 Sleep disorder, unspecified: Secondary | ICD-10-CM | POA: Diagnosis not present

## 2017-09-14 DIAGNOSIS — Z85828 Personal history of other malignant neoplasm of skin: Secondary | ICD-10-CM | POA: Diagnosis not present

## 2017-09-14 DIAGNOSIS — M8588 Other specified disorders of bone density and structure, other site: Secondary | ICD-10-CM | POA: Diagnosis not present

## 2017-09-14 DIAGNOSIS — I10 Essential (primary) hypertension: Secondary | ICD-10-CM | POA: Diagnosis not present

## 2017-09-27 DIAGNOSIS — C4491 Basal cell carcinoma of skin, unspecified: Secondary | ICD-10-CM

## 2017-09-27 DIAGNOSIS — C4492 Squamous cell carcinoma of skin, unspecified: Secondary | ICD-10-CM

## 2017-09-27 HISTORY — DX: Squamous cell carcinoma of skin, unspecified: C44.92

## 2017-09-27 HISTORY — DX: Basal cell carcinoma of skin, unspecified: C44.91

## 2017-10-02 ENCOUNTER — Other Ambulatory Visit: Payer: Self-pay

## 2017-10-02 DIAGNOSIS — L723 Sebaceous cyst: Secondary | ICD-10-CM | POA: Diagnosis not present

## 2017-10-02 DIAGNOSIS — L570 Actinic keratosis: Secondary | ICD-10-CM | POA: Diagnosis not present

## 2017-10-02 DIAGNOSIS — C44319 Basal cell carcinoma of skin of other parts of face: Secondary | ICD-10-CM | POA: Diagnosis not present

## 2017-10-02 DIAGNOSIS — C44622 Squamous cell carcinoma of skin of right upper limb, including shoulder: Secondary | ICD-10-CM | POA: Diagnosis not present

## 2017-10-02 DIAGNOSIS — C4400 Unspecified malignant neoplasm of skin of lip: Secondary | ICD-10-CM | POA: Diagnosis not present

## 2017-10-22 DIAGNOSIS — H353111 Nonexudative age-related macular degeneration, right eye, early dry stage: Secondary | ICD-10-CM | POA: Diagnosis not present

## 2017-10-22 DIAGNOSIS — H40013 Open angle with borderline findings, low risk, bilateral: Secondary | ICD-10-CM | POA: Diagnosis not present

## 2017-10-22 DIAGNOSIS — H353131 Nonexudative age-related macular degeneration, bilateral, early dry stage: Secondary | ICD-10-CM | POA: Diagnosis not present

## 2017-10-22 DIAGNOSIS — Z83511 Family history of glaucoma: Secondary | ICD-10-CM | POA: Diagnosis not present

## 2017-10-22 DIAGNOSIS — H3589 Other specified retinal disorders: Secondary | ICD-10-CM | POA: Diagnosis not present

## 2017-10-22 DIAGNOSIS — H5212 Myopia, left eye: Secondary | ICD-10-CM | POA: Diagnosis not present

## 2017-10-22 DIAGNOSIS — H52221 Regular astigmatism, right eye: Secondary | ICD-10-CM | POA: Diagnosis not present

## 2017-12-03 DIAGNOSIS — N898 Other specified noninflammatory disorders of vagina: Secondary | ICD-10-CM | POA: Diagnosis not present

## 2017-12-03 DIAGNOSIS — N952 Postmenopausal atrophic vaginitis: Secondary | ICD-10-CM | POA: Diagnosis not present

## 2017-12-03 DIAGNOSIS — N3281 Overactive bladder: Secondary | ICD-10-CM | POA: Diagnosis not present

## 2018-01-07 DIAGNOSIS — C441121 Basal cell carcinoma of skin of right upper eyelid, including canthus: Secondary | ICD-10-CM | POA: Diagnosis not present

## 2018-01-18 ENCOUNTER — Encounter (HOSPITAL_COMMUNITY): Payer: Self-pay | Admitting: Emergency Medicine

## 2018-01-18 ENCOUNTER — Ambulatory Visit (HOSPITAL_COMMUNITY)
Admission: EM | Admit: 2018-01-18 | Discharge: 2018-01-18 | Disposition: A | Discharge status: Home / Self Care | Payer: PPO | Attending: Family Medicine | Admitting: Family Medicine

## 2018-01-18 ENCOUNTER — Other Ambulatory Visit: Payer: Self-pay

## 2018-01-18 DIAGNOSIS — I10 Essential (primary) hypertension: Secondary | ICD-10-CM | POA: Insufficient documentation

## 2018-01-18 DIAGNOSIS — M797 Fibromyalgia: Secondary | ICD-10-CM | POA: Insufficient documentation

## 2018-01-18 DIAGNOSIS — Z9889 Other specified postprocedural states: Secondary | ICD-10-CM | POA: Insufficient documentation

## 2018-01-18 DIAGNOSIS — R110 Nausea: Secondary | ICD-10-CM

## 2018-01-18 DIAGNOSIS — R1030 Lower abdominal pain, unspecified: Secondary | ICD-10-CM | POA: Insufficient documentation

## 2018-01-18 DIAGNOSIS — Z9049 Acquired absence of other specified parts of digestive tract: Secondary | ICD-10-CM | POA: Diagnosis not present

## 2018-01-18 DIAGNOSIS — Z882 Allergy status to sulfonamides status: Secondary | ICD-10-CM | POA: Diagnosis not present

## 2018-01-18 DIAGNOSIS — R102 Pelvic and perineal pain: Secondary | ICD-10-CM | POA: Diagnosis not present

## 2018-01-18 DIAGNOSIS — Z90710 Acquired absence of both cervix and uterus: Secondary | ICD-10-CM | POA: Insufficient documentation

## 2018-01-18 DIAGNOSIS — Z833 Family history of diabetes mellitus: Secondary | ICD-10-CM | POA: Diagnosis not present

## 2018-01-18 DIAGNOSIS — Z809 Family history of malignant neoplasm, unspecified: Secondary | ICD-10-CM | POA: Insufficient documentation

## 2018-01-18 DIAGNOSIS — Z79899 Other long term (current) drug therapy: Secondary | ICD-10-CM | POA: Diagnosis not present

## 2018-01-18 LAB — POCT URINALYSIS DIP (DEVICE)
Bilirubin Urine: NEGATIVE
Glucose, UA: NEGATIVE mg/dL
HGB URINE DIPSTICK: NEGATIVE
Ketones, ur: NEGATIVE mg/dL
NITRITE: NEGATIVE
PH: 8.5 — AB (ref 5.0–8.0)
PROTEIN: NEGATIVE mg/dL
Specific Gravity, Urine: 1.015 (ref 1.005–1.030)
Urobilinogen, UA: 1 mg/dL (ref 0.0–1.0)

## 2018-01-18 MED ORDER — CEPHALEXIN 500 MG PO CAPS: 500.0000 mg | ORAL_CAPSULE | Freq: Four times a day (QID) | ORAL | 0 refills | Status: AC

## 2018-01-18 NOTE — ED Triage Notes (Signed)
Last bm was yesterday and normal for patient.  Patient has lower abdominal pain that moves upward.  Pain each day for 4 days, not constant.  Pain is extreme, making patient feel as if she will pass out.  Denies pain with urination.  Patient has had slight nausea, no vomiting.  No back pain.  Pain is not on one side of abdomen anymore than on the other side.

## 2018-01-18 NOTE — ED Notes (Signed)
Patient made aware that this setting may not be able to determine the cause of pain.  Patient may need to go to ed for further evaluation.  Patient agreed to starting care here.

## 2018-01-18 NOTE — Discharge Instructions (Signed)
Increase water intake to empty bladder regularly. Complete course of antibiotics.   Tylenol as needed for pain. I have sent your urine to be cultured to further test for urinary tract infection. If worsening of symptoms, increased pain, vomiting, weakness, passing out, fevers or otherwise worsening please go to the Er for further evaluation.

## 2018-01-18 NOTE — ED Provider Notes (Signed)
Cynthia Daugherty    CSN: [id number] Arrival date & time: 01/18/18  1221     History   Chief Complaint Chief Complaint  Patient presents with  . Abdominal Pain    HPI Cynthia Daugherty is a 77 y.o. female.   [name] presents with low abdominal pain which has been ongoing for the past 4 days. It comes and goes in intensity, states it feels like gas pains but gas treatments have not helped. States she has had normal bowel movements, last was yesterday. Nausea mildly only with severe pain. Without vomiting. No fevers. Denies pain with urination, frequency or urgency. Without blood to urine. Without back pain. Denies blood to stool. Pain is a pressure sensation. Occurs frequently and lasts for seconds. Has tried gas tablets and alka selzer which have not helped. She has had a complete hysterectomy. Has had colonoscopy in the past few years and it was negative, told she did not need to have any further. History of htn, fibromyalgia.    ROS per HPI.       Past Medical History:  Diagnosis Date  . Allergy   . Arthritis   . Hypertension     There are no active problems to display for this patient.   Past Surgical History:  Procedure Laterality Date  . ABDOMINAL HYSTERECTOMY    . CARPAL TUNNEL RELEASE    . CHOLECYSTECTOMY      OB History    No data available       Home Medications    Prior to Admission medications   Medication Sig Start Date End Date Taking? Authorizing Provider  amLODipine (NORVASC) 2.5 MG tablet Take 2.5 mg by mouth daily.    [provider]  cephALEXin (KEFLEX) 500 MG capsule Take 1 capsule (500 mg total) by mouth 4 (four) times daily for 7 days. 01/18/18 01/25/18  Zigmund Gottron, NP  cholecalciferol (VITAMIN D) 1000 UNITS tablet Take 1,000 Units by mouth daily.      [provider]    Family History Family History  Problem Relation Age of Onset  . Cancer Mother   . Diabetes Mother   . Cancer Father   . Cancer Maternal  Grandmother   . Cancer Sister   . Diabetes Sister     Social History Social History   Tobacco Use  . Smoking status: Never Smoker  . Smokeless tobacco: Never Used  Substance Use Topics  . Alcohol use: No    Alcohol/week: 0.0 oz  . Drug use: No     Allergies   Sulfa antibiotics   Review of Systems Review of Systems   Physical Exam Triage Vital Signs ED Triage Vitals  Enc Vitals Group     BP 01/18/18 1416 (!) 162/62     Pulse Rate 01/18/18 1416 65     Resp 01/18/18 1416 (!) 24     Temp 01/18/18 1416 98.1 F (36.7 C)     Temp Source 01/18/18 1416 Oral     SpO2 01/18/18 1416 100 %     Weight --      Height --      Head Circumference --      Peak Flow --      Pain Score 01/18/18 1413 10     Pain Loc --      Pain Edu? --      Excl. in [place]? --    No data found.  Updated Vital Signs BP (!) 162/62 (BP Location: Left  Arm)   Pulse 65   Temp 98.1 F (36.7 C) (Oral)   Resp (!) 24   SpO2 100%   Visual Acuity Right Eye Distance:   Left Eye Distance:   Bilateral Distance:    Right Eye Near:   Left Eye Near:    Bilateral Near:     Physical Exam  Constitutional: She is oriented to person, place, and time. She appears well-developed and well-nourished. No distress.  Cardiovascular: Normal rate, regular rhythm and normal heart sounds.  Pulmonary/Chest: Effort normal and breath sounds normal.  Abdominal: Soft. She exhibits no distension. There is tenderness in the right lower quadrant, suprapubic area and left lower quadrant. There is no rigidity, no rebound, no guarding, no CVA tenderness, no tenderness at McBurney's point and negative Murphy's sign.  Central suprapubic abdomen greatest pain with mild LLQ and RLQ tenderness on palpation   Neurological: She is alert and oriented to person, place, and time.  Skin: Skin is warm and dry.     UC Treatments / Results  Labs (all labs ordered are listed, but only abnormal results are displayed) Labs Reviewed  POCT  URINALYSIS DIP (DEVICE) - Abnormal; Notable for the following components:      Result Value   pH 8.5 (*)    Leukocytes, UA SMALL (*)    All other components within normal limits  URINE CULTURE    EKG  EKG Interpretation None       Radiology No results found.  Procedures Procedures (including critical care time)  Medications Ordered in UC Medications - No data to display   Initial Impression / Assessment and Plan / UC Course  I have reviewed the triage vital signs and the nursing notes.  Pertinent labs & imaging results that were available during my care of the patient were reviewed by me and considered in my medical decision making (see chart for details).     Leukocytes and elevated pH to urine. Pelvic pressure. Concern for UTI, urine sent for culture. Discussed bladder, kidney, intestinal etiologies in differential. Patient states does not feel like previous UTI's she has had in the past. Hx of hysterectomy. Discussed that further imaging and lab evaluation would be necessary for more thorough evaluation. Patient states she is willing to start antibiotics for uti at this time, monitoring symptoms and will return to Er if worsening or no improvement of symptoms. Patient hemodynamically stable at this time, without red flag findings on exam. Will call with any positive culture findings. Patient verbalized understanding and agreeable to plan.  Ambulatory out of clinic without difficulty.    Final Clinical Impressions(s) / UC Diagnoses   Final diagnoses:  Suprapubic pain    ED Discharge Orders        Ordered    cephALEXin (KEFLEX) 500 MG capsule  4 times daily     01/18/18 1457       Controlled Substance Prescriptions [place] Controlled Substance Registry consulted? Not Applicable   Zigmund Gottron, NP 01/18/18 1512

## 2018-01-21 LAB — URINE CULTURE: Culture: >=100000 — AB

## 2018-02-25 DIAGNOSIS — L309 Dermatitis, unspecified: Secondary | ICD-10-CM | POA: Diagnosis not present

## 2018-02-25 DIAGNOSIS — C44622 Squamous cell carcinoma of skin of right upper limb, including shoulder: Secondary | ICD-10-CM | POA: Diagnosis not present

## 2018-03-18 DIAGNOSIS — N952 Postmenopausal atrophic vaginitis: Secondary | ICD-10-CM | POA: Diagnosis not present

## 2018-03-18 DIAGNOSIS — N3281 Overactive bladder: Secondary | ICD-10-CM | POA: Diagnosis not present

## 2018-04-21 DIAGNOSIS — H353131 Nonexudative age-related macular degeneration, bilateral, early dry stage: Secondary | ICD-10-CM | POA: Diagnosis not present

## 2018-04-21 DIAGNOSIS — H18413 Arcus senilis, bilateral: Secondary | ICD-10-CM | POA: Diagnosis not present

## 2018-04-21 DIAGNOSIS — H04123 Dry eye syndrome of bilateral lacrimal glands: Secondary | ICD-10-CM | POA: Diagnosis not present

## 2018-04-21 DIAGNOSIS — H3589 Other specified retinal disorders: Secondary | ICD-10-CM | POA: Diagnosis not present

## 2018-04-21 DIAGNOSIS — Z961 Presence of intraocular lens: Secondary | ICD-10-CM | POA: Diagnosis not present

## 2018-04-21 DIAGNOSIS — H40013 Open angle with borderline findings, low risk, bilateral: Secondary | ICD-10-CM | POA: Diagnosis not present

## 2018-04-21 DIAGNOSIS — H40009 Preglaucoma, unspecified, unspecified eye: Secondary | ICD-10-CM | POA: Diagnosis not present

## 2018-04-21 DIAGNOSIS — H11153 Pinguecula, bilateral: Secondary | ICD-10-CM | POA: Diagnosis not present

## 2018-04-21 DIAGNOSIS — H353191 Nonexudative age-related macular degeneration, unspecified eye, early dry stage: Secondary | ICD-10-CM | POA: Diagnosis not present

## 2018-08-09 DIAGNOSIS — R351 Nocturia: Secondary | ICD-10-CM | POA: Diagnosis not present

## 2018-08-09 DIAGNOSIS — K219 Gastro-esophageal reflux disease without esophagitis: Secondary | ICD-10-CM | POA: Diagnosis not present

## 2018-08-09 DIAGNOSIS — E785 Hyperlipidemia, unspecified: Secondary | ICD-10-CM | POA: Diagnosis not present

## 2018-08-09 DIAGNOSIS — F419 Anxiety disorder, unspecified: Secondary | ICD-10-CM | POA: Diagnosis not present

## 2018-08-09 DIAGNOSIS — I10 Essential (primary) hypertension: Secondary | ICD-10-CM | POA: Diagnosis not present

## 2018-08-09 DIAGNOSIS — M8588 Other specified disorders of bone density and structure, other site: Secondary | ICD-10-CM | POA: Diagnosis not present

## 2018-08-09 DIAGNOSIS — R609 Edema, unspecified: Secondary | ICD-10-CM | POA: Diagnosis not present

## 2018-11-11 DIAGNOSIS — M8588 Other specified disorders of bone density and structure, other site: Secondary | ICD-10-CM | POA: Diagnosis not present

## 2018-11-11 DIAGNOSIS — M25511 Pain in right shoulder: Secondary | ICD-10-CM | POA: Diagnosis not present

## 2018-11-11 DIAGNOSIS — G479 Sleep disorder, unspecified: Secondary | ICD-10-CM | POA: Diagnosis not present

## 2018-11-11 DIAGNOSIS — Z6835 Body mass index (BMI) 35.0-35.9, adult: Secondary | ICD-10-CM | POA: Diagnosis not present

## 2018-11-11 DIAGNOSIS — R609 Edema, unspecified: Secondary | ICD-10-CM | POA: Diagnosis not present

## 2018-11-11 DIAGNOSIS — Z0000 Encounter for general adult medical examination without abnormal findings: Secondary | ICD-10-CM | POA: Diagnosis not present

## 2018-11-11 DIAGNOSIS — R300 Dysuria: Secondary | ICD-10-CM | POA: Diagnosis not present

## 2018-11-11 DIAGNOSIS — I10 Essential (primary) hypertension: Secondary | ICD-10-CM | POA: Diagnosis not present

## 2018-11-11 DIAGNOSIS — M797 Fibromyalgia: Secondary | ICD-10-CM | POA: Diagnosis not present

## 2018-11-11 DIAGNOSIS — E785 Hyperlipidemia, unspecified: Secondary | ICD-10-CM | POA: Diagnosis not present

## 2018-11-13 DIAGNOSIS — M67911 Unspecified disorder of synovium and tendon, right shoulder: Secondary | ICD-10-CM | POA: Diagnosis not present

## 2018-12-03 DIAGNOSIS — M67911 Unspecified disorder of synovium and tendon, right shoulder: Secondary | ICD-10-CM | POA: Diagnosis not present

## 2018-12-03 DIAGNOSIS — M6281 Muscle weakness (generalized): Secondary | ICD-10-CM | POA: Diagnosis not present

## 2018-12-06 DIAGNOSIS — M67911 Unspecified disorder of synovium and tendon, right shoulder: Secondary | ICD-10-CM | POA: Diagnosis not present

## 2018-12-06 DIAGNOSIS — M6281 Muscle weakness (generalized): Secondary | ICD-10-CM | POA: Diagnosis not present

## 2018-12-13 DIAGNOSIS — M6281 Muscle weakness (generalized): Secondary | ICD-10-CM | POA: Diagnosis not present

## 2018-12-13 DIAGNOSIS — Z90710 Acquired absence of both cervix and uterus: Secondary | ICD-10-CM | POA: Diagnosis not present

## 2018-12-13 DIAGNOSIS — M67911 Unspecified disorder of synovium and tendon, right shoulder: Secondary | ICD-10-CM | POA: Diagnosis not present

## 2018-12-13 DIAGNOSIS — Z1231 Encounter for screening mammogram for malignant neoplasm of breast: Secondary | ICD-10-CM | POA: Diagnosis not present

## 2018-12-13 DIAGNOSIS — M8589 Other specified disorders of bone density and structure, multiple sites: Secondary | ICD-10-CM | POA: Diagnosis not present

## 2018-12-16 DIAGNOSIS — M6281 Muscle weakness (generalized): Secondary | ICD-10-CM | POA: Diagnosis not present

## 2018-12-16 DIAGNOSIS — M67911 Unspecified disorder of synovium and tendon, right shoulder: Secondary | ICD-10-CM | POA: Diagnosis not present

## 2018-12-23 DIAGNOSIS — M67911 Unspecified disorder of synovium and tendon, right shoulder: Secondary | ICD-10-CM | POA: Diagnosis not present

## 2018-12-23 DIAGNOSIS — M6281 Muscle weakness (generalized): Secondary | ICD-10-CM | POA: Diagnosis not present

## 2018-12-27 DIAGNOSIS — M67911 Unspecified disorder of synovium and tendon, right shoulder: Secondary | ICD-10-CM | POA: Diagnosis not present

## 2018-12-27 DIAGNOSIS — M6281 Muscle weakness (generalized): Secondary | ICD-10-CM | POA: Diagnosis not present

## 2019-01-10 DIAGNOSIS — M25561 Pain in right knee: Secondary | ICD-10-CM | POA: Diagnosis not present

## 2019-01-14 DIAGNOSIS — M67911 Unspecified disorder of synovium and tendon, right shoulder: Secondary | ICD-10-CM | POA: Diagnosis not present

## 2019-02-11 DIAGNOSIS — M67911 Unspecified disorder of synovium and tendon, right shoulder: Secondary | ICD-10-CM | POA: Diagnosis not present

## 2019-04-07 DIAGNOSIS — H40023 Open angle with borderline findings, high risk, bilateral: Secondary | ICD-10-CM | POA: Diagnosis not present

## 2019-04-07 DIAGNOSIS — H18419 Arcus senilis, unspecified eye: Secondary | ICD-10-CM | POA: Diagnosis not present

## 2019-04-07 DIAGNOSIS — H04129 Dry eye syndrome of unspecified lacrimal gland: Secondary | ICD-10-CM | POA: Diagnosis not present

## 2019-04-07 DIAGNOSIS — H353130 Nonexudative age-related macular degeneration, bilateral, stage unspecified: Secondary | ICD-10-CM | POA: Diagnosis not present

## 2019-04-07 DIAGNOSIS — H1851 Endothelial corneal dystrophy: Secondary | ICD-10-CM | POA: Diagnosis not present

## 2019-04-07 DIAGNOSIS — H11159 Pinguecula, unspecified eye: Secondary | ICD-10-CM | POA: Diagnosis not present

## 2019-05-11 DIAGNOSIS — I788 Other diseases of capillaries: Secondary | ICD-10-CM | POA: Diagnosis not present

## 2019-06-06 DIAGNOSIS — E785 Hyperlipidemia, unspecified: Secondary | ICD-10-CM | POA: Diagnosis not present

## 2019-06-06 DIAGNOSIS — I10 Essential (primary) hypertension: Secondary | ICD-10-CM | POA: Diagnosis not present

## 2019-06-06 DIAGNOSIS — M8588 Other specified disorders of bone density and structure, other site: Secondary | ICD-10-CM | POA: Diagnosis not present

## 2019-06-06 DIAGNOSIS — R300 Dysuria: Secondary | ICD-10-CM | POA: Diagnosis not present

## 2019-06-06 DIAGNOSIS — R609 Edema, unspecified: Secondary | ICD-10-CM | POA: Diagnosis not present

## 2019-06-23 DIAGNOSIS — I10 Essential (primary) hypertension: Secondary | ICD-10-CM | POA: Diagnosis not present

## 2019-06-23 DIAGNOSIS — E785 Hyperlipidemia, unspecified: Secondary | ICD-10-CM | POA: Diagnosis not present

## 2019-06-23 DIAGNOSIS — R609 Edema, unspecified: Secondary | ICD-10-CM | POA: Diagnosis not present

## 2019-06-23 DIAGNOSIS — R300 Dysuria: Secondary | ICD-10-CM | POA: Diagnosis not present

## 2019-06-23 DIAGNOSIS — M8588 Other specified disorders of bone density and structure, other site: Secondary | ICD-10-CM | POA: Diagnosis not present

## 2019-07-20 DIAGNOSIS — S43402A Unspecified sprain of left shoulder joint, initial encounter: Secondary | ICD-10-CM | POA: Diagnosis not present

## 2019-07-20 DIAGNOSIS — S63502A Unspecified sprain of left wrist, initial encounter: Secondary | ICD-10-CM | POA: Diagnosis not present

## 2019-12-06 DIAGNOSIS — U071 COVID-19: Secondary | ICD-10-CM | POA: Diagnosis not present

## 2020-01-16 DIAGNOSIS — Z87440 Personal history of urinary (tract) infections: Secondary | ICD-10-CM | POA: Diagnosis not present

## 2020-01-16 DIAGNOSIS — E559 Vitamin D deficiency, unspecified: Secondary | ICD-10-CM | POA: Diagnosis not present

## 2020-01-16 DIAGNOSIS — I10 Essential (primary) hypertension: Secondary | ICD-10-CM | POA: Diagnosis not present

## 2020-01-16 DIAGNOSIS — E785 Hyperlipidemia, unspecified: Secondary | ICD-10-CM | POA: Diagnosis not present

## 2020-01-16 DIAGNOSIS — R609 Edema, unspecified: Secondary | ICD-10-CM | POA: Diagnosis not present

## 2020-01-16 DIAGNOSIS — M545 Low back pain: Secondary | ICD-10-CM | POA: Diagnosis not present

## 2020-03-09 ENCOUNTER — Ambulatory Visit: Payer: PPO | Admitting: Physician Assistant

## 2020-03-27 DIAGNOSIS — M25561 Pain in right knee: Secondary | ICD-10-CM | POA: Diagnosis not present

## 2020-10-16 DIAGNOSIS — I10 Essential (primary) hypertension: Secondary | ICD-10-CM | POA: Diagnosis not present

## 2020-10-16 DIAGNOSIS — E559 Vitamin D deficiency, unspecified: Secondary | ICD-10-CM | POA: Diagnosis not present

## 2020-10-16 DIAGNOSIS — R609 Edema, unspecified: Secondary | ICD-10-CM | POA: Diagnosis not present

## 2020-10-16 DIAGNOSIS — K219 Gastro-esophageal reflux disease without esophagitis: Secondary | ICD-10-CM | POA: Diagnosis not present

## 2020-10-16 DIAGNOSIS — E785 Hyperlipidemia, unspecified: Secondary | ICD-10-CM | POA: Diagnosis not present

## 2020-11-12 DIAGNOSIS — Z0000 Encounter for general adult medical examination without abnormal findings: Secondary | ICD-10-CM | POA: Diagnosis not present

## 2020-11-12 DIAGNOSIS — Z1389 Encounter for screening for other disorder: Secondary | ICD-10-CM | POA: Diagnosis not present

## 2020-11-13 ENCOUNTER — Other Ambulatory Visit: Payer: Self-pay | Admitting: Family Medicine

## 2020-11-13 DIAGNOSIS — Z1231 Encounter for screening mammogram for malignant neoplasm of breast: Secondary | ICD-10-CM

## 2020-12-27 DIAGNOSIS — Z1231 Encounter for screening mammogram for malignant neoplasm of breast: Secondary | ICD-10-CM | POA: Diagnosis not present

## 2021-03-17 DIAGNOSIS — R61 Generalized hyperhidrosis: Secondary | ICD-10-CM | POA: Diagnosis not present

## 2021-03-17 DIAGNOSIS — M542 Cervicalgia: Secondary | ICD-10-CM | POA: Diagnosis not present

## 2021-03-17 DIAGNOSIS — M79603 Pain in arm, unspecified: Secondary | ICD-10-CM | POA: Diagnosis not present

## 2021-04-25 DIAGNOSIS — E785 Hyperlipidemia, unspecified: Secondary | ICD-10-CM | POA: Diagnosis not present

## 2021-04-25 DIAGNOSIS — M797 Fibromyalgia: Secondary | ICD-10-CM | POA: Diagnosis not present

## 2021-04-25 DIAGNOSIS — F419 Anxiety disorder, unspecified: Secondary | ICD-10-CM | POA: Diagnosis not present

## 2021-04-25 DIAGNOSIS — M5412 Radiculopathy, cervical region: Secondary | ICD-10-CM | POA: Diagnosis not present

## 2021-04-25 DIAGNOSIS — I10 Essential (primary) hypertension: Secondary | ICD-10-CM | POA: Diagnosis not present

## 2021-04-25 DIAGNOSIS — K219 Gastro-esophageal reflux disease without esophagitis: Secondary | ICD-10-CM | POA: Diagnosis not present

## 2021-05-16 DIAGNOSIS — M5412 Radiculopathy, cervical region: Secondary | ICD-10-CM | POA: Diagnosis not present

## 2021-05-17 ENCOUNTER — Other Ambulatory Visit: Payer: Self-pay | Admitting: Student

## 2021-05-17 DIAGNOSIS — M5412 Radiculopathy, cervical region: Secondary | ICD-10-CM

## 2021-05-28 ENCOUNTER — Ambulatory Visit
Admission: RE | Admit: 2021-05-28 | Discharge: 2021-05-28 | Disposition: A | Discharge status: Home / Self Care | Payer: PPO | Source: Ambulatory Visit | Attending: Student | Admitting: Student

## 2021-05-28 DIAGNOSIS — R200 Anesthesia of skin: Secondary | ICD-10-CM | POA: Diagnosis not present

## 2021-05-28 DIAGNOSIS — M47812 Spondylosis without myelopathy or radiculopathy, cervical region: Secondary | ICD-10-CM | POA: Diagnosis not present

## 2021-05-28 DIAGNOSIS — M5021 Other cervical disc displacement,  high cervical region: Secondary | ICD-10-CM | POA: Diagnosis not present

## 2021-05-28 DIAGNOSIS — M5412 Radiculopathy, cervical region: Secondary | ICD-10-CM

## 2021-05-28 DIAGNOSIS — M50223 Other cervical disc displacement at C6-C7 level: Secondary | ICD-10-CM | POA: Diagnosis not present

## 2021-05-30 DIAGNOSIS — M5412 Radiculopathy, cervical region: Secondary | ICD-10-CM | POA: Diagnosis not present

## 2021-06-13 DIAGNOSIS — M5442 Lumbago with sciatica, left side: Secondary | ICD-10-CM | POA: Diagnosis not present

## 2021-06-13 DIAGNOSIS — M545 Low back pain, unspecified: Secondary | ICD-10-CM | POA: Diagnosis not present

## 2021-11-24 DIAGNOSIS — R0981 Nasal congestion: Secondary | ICD-10-CM | POA: Diagnosis not present

## 2021-11-24 DIAGNOSIS — J209 Acute bronchitis, unspecified: Secondary | ICD-10-CM | POA: Diagnosis not present

## 2021-11-24 DIAGNOSIS — R519 Headache, unspecified: Secondary | ICD-10-CM | POA: Diagnosis not present

## 2021-11-24 DIAGNOSIS — J069 Acute upper respiratory infection, unspecified: Secondary | ICD-10-CM | POA: Diagnosis not present

## 2021-12-03 DIAGNOSIS — B029 Zoster without complications: Secondary | ICD-10-CM | POA: Diagnosis not present

## 2022-01-29 DIAGNOSIS — R0981 Nasal congestion: Secondary | ICD-10-CM | POA: Diagnosis not present

## 2022-01-29 DIAGNOSIS — J209 Acute bronchitis, unspecified: Secondary | ICD-10-CM | POA: Diagnosis not present

## 2022-01-29 DIAGNOSIS — J019 Acute sinusitis, unspecified: Secondary | ICD-10-CM | POA: Diagnosis not present

## 2022-02-04 DIAGNOSIS — I10 Essential (primary) hypertension: Secondary | ICD-10-CM | POA: Diagnosis not present

## 2022-02-04 DIAGNOSIS — M8588 Other specified disorders of bone density and structure, other site: Secondary | ICD-10-CM | POA: Diagnosis not present

## 2022-02-04 DIAGNOSIS — F419 Anxiety disorder, unspecified: Secondary | ICD-10-CM | POA: Diagnosis not present

## 2022-02-04 DIAGNOSIS — Z0000 Encounter for general adult medical examination without abnormal findings: Secondary | ICD-10-CM | POA: Diagnosis not present

## 2022-02-04 DIAGNOSIS — E559 Vitamin D deficiency, unspecified: Secondary | ICD-10-CM | POA: Diagnosis not present

## 2022-02-04 DIAGNOSIS — B029 Zoster without complications: Secondary | ICD-10-CM | POA: Diagnosis not present

## 2022-02-04 DIAGNOSIS — E785 Hyperlipidemia, unspecified: Secondary | ICD-10-CM | POA: Diagnosis not present

## 2023-12-23 ENCOUNTER — Other Ambulatory Visit: Payer: Self-pay

## 2023-12-23 ENCOUNTER — Emergency Department (HOSPITAL_COMMUNITY): Payer: PPO

## 2023-12-23 ENCOUNTER — Encounter (HOSPITAL_COMMUNITY): Payer: Self-pay | Admitting: Pharmacy Technician

## 2023-12-23 ENCOUNTER — Emergency Department (HOSPITAL_COMMUNITY)
Admission: EM | Admit: 2023-12-23 | Discharge: 2023-12-23 | Disposition: A | Discharge status: Home / Self Care | Payer: PPO | Attending: Emergency Medicine | Admitting: Emergency Medicine

## 2023-12-23 DIAGNOSIS — R000 Tachycardia, unspecified: Secondary | ICD-10-CM | POA: Diagnosis not present

## 2023-12-23 DIAGNOSIS — M791 Myalgia, unspecified site: Secondary | ICD-10-CM | POA: Insufficient documentation

## 2023-12-23 DIAGNOSIS — R0602 Shortness of breath: Secondary | ICD-10-CM | POA: Insufficient documentation

## 2023-12-23 DIAGNOSIS — R6883 Chills (without fever): Secondary | ICD-10-CM | POA: Insufficient documentation

## 2023-12-23 LAB — CBC WITH DIFFERENTIAL/PLATELET
Abs Immature Granulocytes: 0.06 10*3/uL (ref 0.00–0.07)
Basophils Absolute: 0 10*3/uL (ref 0.0–0.1)
Basophils Relative: 1 %
Eosinophils Absolute: 0 10*3/uL (ref 0.0–0.5)
Eosinophils Relative: 0 %
HCT: 38.9 % (ref 36.0–46.0)
Hemoglobin: 12.6 g/dL (ref 12.0–15.0)
Immature Granulocytes: 1 %
Lymphocytes Relative: 7 %
Lymphs Abs: 0.4 10*3/uL — ABNORMAL LOW (ref 0.7–4.0)
MCH: 29.6 pg (ref 26.0–34.0)
MCHC: 32.4 g/dL (ref 30.0–36.0)
MCV: 91.5 fL (ref 80.0–100.0)
Monocytes Absolute: 0.1 10*3/uL (ref 0.1–1.0)
Monocytes Relative: 2 %
Neutro Abs: 4.6 10*3/uL (ref 1.7–7.7)
Neutrophils Relative %: 89 %
Platelets: 328 10*3/uL (ref 150–400)
RBC: 4.25 MIL/uL (ref 3.87–5.11)
RDW: 13.9 % (ref 11.5–15.5)
WBC: 5.2 10*3/uL (ref 4.0–10.5)
nRBC: 0 % (ref 0.0–0.2)

## 2023-12-23 LAB — BASIC METABOLIC PANEL
Anion gap: 14 (ref 5–15)
BUN: 12 mg/dL (ref 8–23)
CO2: 22 mmol/L (ref 22–32)
Calcium: 8.8 mg/dL — ABNORMAL LOW (ref 8.9–10.3)
Chloride: 92 mmol/L — ABNORMAL LOW (ref 98–111)
Creatinine, Ser: 0.89 mg/dL (ref 0.44–1.00)
GFR, Estimated: >60 mL/min (ref >60)
Glucose, Bld: 146 mg/dL — ABNORMAL HIGH (ref 70–99)
Potassium: 3.6 mmol/L (ref 3.5–5.1)
Sodium: 128 mmol/L — ABNORMAL LOW (ref 135–145)

## 2023-12-23 LAB — D-DIMER, QUANTITATIVE: D-Dimer, Quant: 0.56 ug{FEU}/mL — ABNORMAL HIGH (ref 0.00–0.50)

## 2023-12-23 MED ORDER — IBUPROFEN 200 MG PO TABS
600.0000 mg | ORAL_TABLET | Freq: Once | ORAL | Status: AC
  Administered 2023-12-23: 600 mg via ORAL
  Filled 2023-12-23: qty 1

## 2023-12-23 MED ORDER — SODIUM CHLORIDE 0.9 % IV BOLUS
1000.0000 mL | Freq: Once | INTRAVENOUS | Status: AC
  Administered 2023-12-23: 1000 mL via INTRAVENOUS

## 2023-12-23 MED ORDER — BENZONATATE 100 MG PO CAPS: 100.0000 mg | ORAL_CAPSULE | Freq: Three times a day (TID) | ORAL | 0 refills | Status: AC

## 2023-12-23 MED ORDER — IOHEXOL 350 MG/ML SOLN
65.0000 mL | Freq: Once | INTRAVENOUS | Status: AC | PRN
  Administered 2023-12-23: 65 mL via INTRAVENOUS

## 2023-12-23 NOTE — ED Provider Notes (Signed)
[place] EMERGENCY DEPARTMENT AT [facility] Provider Note   CSN: [id number] Arrival date & time: 12/23/23  1209     History No chief complaint on file.   Cynthia Daugherty is a 83 y.o. female who presents to the ED out of concern of PE.  Patient reports that she was seen at an urgent care this morning due to feeling very ill with bodyaches and chills.  The patient reports that she "knew she had the flu".  Patient states that she was diagnosed with fluid at this visit but was noted to be tachypneic, short of breath with the urgent care provider.  Patient denies feeling short of breath, states that this was noticed by her urgent care provider.  Patient was sent with paperwork which states that the patient received a chest x-ray, DuoNeb, steroids and her tachypnea and appearance of shortness of breath persisted.  The patient was sent here to rule out PE.  The patient reports a history of knee surgery in November.  Denies a history of DVT or PE.  Denies chest pain, nausea, vomiting, abdominal pain at home.  Reports slightly elevated temperatures at home but no overt fevers.  Denies sore throat, any known sick contacts.  Denies a history of asthma or COPD.  Does not smoke cigarettes.  HPI     Home Medications Prior to Admission medications   Medication Sig Start Date End Date Taking? Authorizing Provider  amLODipine (NORVASC) 5 MG tablet Take 5 mg by mouth at bedtime.   Yes [provider]  benzonatate (TESSALON) 100 MG capsule Take 1 capsule (100 mg total) by mouth every 8 (eight) hours. 12/23/23  Yes Al Decant, PA-C  Cholecalciferol (VITAMIN D-3 PO) Take 1 tablet by mouth at bedtime.   Yes [provider]  ibuprofen (ADVIL) 200 MG tablet Take 400 mg by mouth every 6 (six) hours as needed for fever, headache or moderate pain (pain score 4-6).   Yes [provider]  Magnesium 400 MG TABS Take 400 mg by mouth at bedtime.   Yes [provider]      Allergies    Sulfa antibiotics    Review of Systems   Review of Systems  Constitutional:  Negative for fever.  Respiratory:  Positive for cough and shortness of breath. Negative for wheezing and stridor.   Cardiovascular:  Negative for chest pain.  Gastrointestinal:  Negative for abdominal pain, nausea and vomiting.  All other systems reviewed and are negative.   Physical Exam Updated Vital Signs BP (!) 137/58   Pulse 60   Temp 99.9 F (37.7 C) (Oral)   Resp 16   Ht 5\' 2"  (1.575 m)   Wt 79.4 kg   SpO2 96%   BMI 32.01 kg/m  Physical Exam Vitals and nursing note reviewed.  Constitutional:      General: She is not in acute distress.    Appearance: Normal appearance. She is not ill-appearing, toxic-appearing or diaphoretic.  HENT:     Head: Normocephalic and atraumatic.     Nose: Nose normal.     Mouth/Throat:     Mouth: Mucous membranes are moist.     Pharynx: Oropharynx is clear.  Eyes:     Extraocular Movements: Extraocular movements intact.     Conjunctiva/sclera: Conjunctivae normal.     Pupils: Pupils are equal, round, and reactive to light.  Pulmonary:     Effort: Pulmonary effort is normal.     Breath sounds: Normal  breath sounds. No wheezing.  Abdominal:     General: Abdomen is flat. Bowel sounds are normal.     Palpations: Abdomen is soft.     Tenderness: There is no abdominal tenderness.  Skin:    General: Skin is warm and dry.     Capillary Refill: Capillary refill takes less than 2 seconds.  Neurological:     Mental Status: She is alert and oriented to person, place, and time.     ED Results / Procedures / Treatments   Labs (all labs ordered are listed, but only abnormal results are displayed) Labs Reviewed  CBC WITH DIFFERENTIAL/PLATELET - Abnormal; Notable for the following components:      Result Value   Lymphs Abs 0.4 (*)    All other components within normal limits  BASIC METABOLIC PANEL - Abnormal; Notable for the following  components:   Sodium 128 (*)    Chloride 92 (*)    Glucose, Bld 146 (*)    Calcium 8.8 (*)    All other components within normal limits  D-DIMER, QUANTITATIVE - Abnormal; Notable for the following components:   D-Dimer, Quant 0.56 (*)    All other components within normal limits    EKG None  Radiology CT Angio Chest PE W and/or Wo Contrast Result Date: 12/23/2023 CLINICAL DATA:  Pulmonary embolism suspected.  Positive D-dimer. EXAM: CT ANGIOGRAPHY CHEST WITH CONTRAST TECHNIQUE: Multidetector CT imaging of the chest was performed using the standard protocol during bolus administration of intravenous contrast. Multiplanar CT image reconstructions and MIPs were obtained to evaluate the vascular anatomy. RADIATION DOSE REDUCTION: This exam was performed according to the departmental dose-optimization program which includes automated exposure control, adjustment of the mA and/or kV according to patient size and/or use of iterative reconstruction technique. CONTRAST:  65mL OMNIPAQUE IOHEXOL 350 MG/ML SOLN COMPARISON:  CT abdomen 2011. FINDINGS: Cardiovascular: No filling defects within the pulmonary arteries to suggest acute pulmonary embolism. Mediastinum/Nodes: No axillary or supraclavicular adenopathy. No mediastinal or hilar adenopathy. No pericardial fluid. Esophagus normal. Lungs/Pleura: No pulmonary infarction. No pneumonia. No pleural fluid. No pneumothorax RIGHT lobe pulmonary nodule measures 4 mm (image 88/6) Upper Abdomen: There is a large well-circumscribed RIGHT suprarenal fluid collection measuring 7 cm x 9.7 cm (image 128/series 5). This presumably represents a RIGHT renal cyst. Smaller RIGHT renal cyst noted on CT from 2011 measuring 2.5 cm. Musculoskeletal: Degenerative osteophytosis of the spine. Review of the MIP images confirms the above findings. IMPRESSION: 1. No evidence acute pulmonary embolism. 2. No acute pulmonary findings. 3. RIGHT lobe pulmonary nodule measures 4 mm. No  follow-up needed if patient is low-risk.This recommendation follows the consensus statement: Guidelines for Management of Incidental Pulmonary Nodules Detected on CT Images: From the Fleischner Society 2017; Radiology 2017; 284:228-243. 4. Probable large benign RIGHT supra renal cyst. Cyst not completely imaged. No recent comparison Electronically Signed   By: Genevive Bi M.D.   On: 12/23/2023 21:55    Procedures Procedures    Medications Ordered in ED Medications  ibuprofen (ADVIL) tablet 600 mg (has no administration in time range)  sodium chloride 0.9 % bolus 1,000 mL (0 mLs Intravenous Stopped 12/23/23 1951)  iohexol (OMNIPAQUE) 350 MG/ML injection 65 mL (65 mLs Intravenous Contrast Given 12/23/23 2030)    ED Course/ Medical Decision Making/ A&P  Medical Decision Making Amount and/or Complexity of Data Reviewed Radiology: ordered.  Risk Prescription drug management.   83 year old female presents for evaluation.  Please see HPI for further details.  On  exam patient is afebrile and nontachycardic.  Her lung sounds are clear bilaterally, she is not hypoxic.  Abdomen soft and compressible.  No edema to bilateral extremities.  Neurological examinations at baseline.  Patient sent by urgent care provider due to concern for PE.  Patient CBC without leukocytosis or anemia.  Metabolic panel with sodium 128 however no other electrolyte derangement.  D-dimer is elevated to 2.56.  CTA unremarkable.  No evidence of PE.  No evidence of pneumonia.  EKG nonischemic.  Patient ambulated and maintained oxygen saturation greater than 95% on room air.  Most likely patient shortness of breath secondary to influenza A.  Patient discharged at this time with [facility] for cough.  Reports that she is already been given albuterol inhaler by urgent care provider.  Will have her follow-up with her PCP.  Encouraged to return to the ED with any new or worsening symptoms.  Final Clinical Impression(s)  / ED Diagnoses Final diagnoses:  Shortness of breath    Rx / DC Orders ED Discharge Orders          Ordered    benzonatate (TESSALON) 100 MG capsule  Every 8 hours        12/23/23 2240              Al Decant, PA-C 12/23/23 2240    Anders Simmonds T, DO 12/24/23 1150

## 2023-12-23 NOTE — ED Notes (Signed)
Patient transported to CT

## 2023-12-23 NOTE — ED Notes (Signed)
Pt ambulated, remained above 95% with increased work of breathing. When asked, pt stated, "I just have to catch my breath" pt continued on to the bathroom with no ambulation issues.

## 2023-12-23 NOTE — Discharge Instructions (Signed)
It was a pleasure taking part in your care.  As discussed, you workup is reassuring.  No evidence of blood clot on CT scan.  Your shortness of breath is most likely secondary to having the flu.  Please follow-up with your primary care doctor for further management and reevaluation.  Please utilize albuterol inhaler given to you by urgent care provider as well as Jerilynn Som sent to your pharmacy.  Please return to the ED with any new or worsening symptoms.

## 2023-12-23 NOTE — ED Triage Notes (Signed)
Pt here POV, seen at [facility] for flu, xray was negative. Due to patient being short of breath sent here for PE rule out. Knee replacement in November.

## 2023-12-23 NOTE — ED Notes (Signed)
Ct notified pt has 20 gauge IV and ready for study

## 2023-12-23 NOTE — ED Provider Triage Note (Signed)
Emergency Medicine Provider Triage Evaluation Note  Cynthia Daugherty , a 83 y.o. female  was evaluated in triage.  Pt complains of  viral symptoms.  Review of Systems  Positive:  Negative:   Physical Exam  BP (!) 146/65   Pulse 73   Temp 97.9 F (36.6 C) (Oral)   Resp 18   Ht 5\' 2"  (1.575 m)   Wt 79.4 kg   SpO2 95%   BMI 32.01 kg/m  Gen:   Awake, no distress   Resp:  Normal effort  MSK:   Moves extremities without difficulty  Other:    Medical Decision Making  Medically screening exam initiated at 2:01 PM.  Appropriate orders placed.  Cynthia Daugherty was informed that the remainder of the evaluation will be completed by another provider, this initial triage assessment does not replace that evaluation, and the importance of remaining in the ED until their evaluation is complete.  Patient went to UC for viral symptoms: cough, chills, fever, headache, rhinorrhea, congestion. Patient's O2 at UC was initially 91-92% on RA which improved to 94-95% with breathing treatment. Workup was unremarkable - including chest xray - so provider sent patient to ED for PE rule out. Patient stating that she does not feel SOB - just feels bad from being sick with flu. Flu test was positive at [facility].   Cynthia Daugherty, [place] 12/23/23 [phone]

## 2024-06-20 ENCOUNTER — Ambulatory Visit: Payer: Self-pay | Admitting: Podiatry

## 2024-06-20 ENCOUNTER — Encounter: Payer: Self-pay | Admitting: Podiatry

## 2024-06-20 ENCOUNTER — Ambulatory Visit (INDEPENDENT_AMBULATORY_CARE_PROVIDER_SITE_OTHER)

## 2024-06-20 DIAGNOSIS — M2041 Other hammer toe(s) (acquired), right foot: Secondary | ICD-10-CM

## 2024-06-20 DIAGNOSIS — B351 Tinea unguium: Secondary | ICD-10-CM | POA: Diagnosis not present

## 2024-06-20 DIAGNOSIS — M79674 Pain in right toe(s): Secondary | ICD-10-CM | POA: Diagnosis not present

## 2024-06-20 DIAGNOSIS — M898X9 Other specified disorders of bone, unspecified site: Secondary | ICD-10-CM | POA: Diagnosis not present

## 2024-06-20 DIAGNOSIS — Q6689 Other  specified congenital deformities of feet: Secondary | ICD-10-CM

## 2024-06-21 NOTE — Progress Notes (Signed)
 Subjective:   Patient ID: Cynthia Daugherty, female   DOB: 83 y.o.   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  States the nails can bother her some and patient does not smoke likes to be active   Review of Systems  All other systems reviewed and are negative.       Objective:  Physical Exam Vitals and nursing note reviewed.  Constitutional:      Appearance: She is well-developed.  Pulmonary:     Effort: Pulmonary effort is normal.  Musculoskeletal:        General: Normal range of motion.  Skin:    General: Skin is warm.  Neurological:     Mental Status: She is alert.     Neurovascular status was found to be mildly diminished but intact range of motion subtalar midtarsal joint adequate with the patient found to have normal muscle strength.  Patient is found to have thickened hallux nailbeds of both feet that are dystrophic and deformed and has digital deformity second right with excess ptotic lesion which can be bothersome.  Patient is noted to have good digital perfusion well oriented x 3     Assessment:  Chronic deformity with mycotic nail and also lesion and digital deformities noted     Plan:  H&P reviewed conditions and today I debrided nails and discussed possible removal of the nailbed but I am concerned about possible circulatory disease so if trimming will work I would prefer this approach currently.  She will be seen back do not recommend current treatment for digital deformity spur formation  X-rays indicate moderate digital deformity second right elevated digit with pressure between the hallux and second toe
